# Patient Record
Sex: Female | Born: 1975 | Race: White | Hispanic: No | Smoking: Never smoker
Health system: Southern US, Community
[De-identification: ages and names within clinical notes are randomized; demographics above are authoritative.]

## PROBLEM LIST (undated history)

## (undated) DIAGNOSIS — K219 Gastro-esophageal reflux disease without esophagitis: Secondary | ICD-10-CM

## (undated) DIAGNOSIS — F419 Anxiety disorder, unspecified: Secondary | ICD-10-CM

## (undated) DIAGNOSIS — I10 Essential (primary) hypertension: Secondary | ICD-10-CM

## (undated) HISTORY — PX: URETHERAL RE-IMPLANTATION: SHX2616

## (undated) HISTORY — PX: BREAST REDUCTION SURGERY: SHX8

## (undated) HISTORY — DX: Gastro-esophageal reflux disease without esophagitis: K21.9

## (undated) HISTORY — DX: Essential (primary) hypertension: I10

---

## 1998-01-01 ENCOUNTER — Inpatient Hospital Stay (HOSPITAL_COMMUNITY): Admission: AD | Admit: 1998-01-01 | Discharge: 1998-01-04 | Payer: Self-pay | Admitting: Obstetrics and Gynecology

## 1998-01-07 ENCOUNTER — Encounter (HOSPITAL_COMMUNITY): Admission: RE | Admit: 1998-01-07 | Discharge: 1998-04-07 | Payer: Self-pay | Admitting: *Deleted

## 1999-05-24 ENCOUNTER — Inpatient Hospital Stay (HOSPITAL_COMMUNITY): Admission: AD | Admit: 1999-05-24 | Discharge: 1999-05-26 | Payer: Self-pay | Admitting: Obstetrics and Gynecology

## 1999-06-28 ENCOUNTER — Other Ambulatory Visit: Admission: RE | Admit: 1999-06-28 | Discharge: 1999-06-28 | Payer: Self-pay | Admitting: Obstetrics and Gynecology

## 2001-07-15 ENCOUNTER — Other Ambulatory Visit: Admission: RE | Admit: 2001-07-15 | Discharge: 2001-07-15 | Payer: Self-pay | Admitting: Obstetrics and Gynecology

## 2002-11-16 ENCOUNTER — Other Ambulatory Visit: Admission: RE | Admit: 2002-11-16 | Discharge: 2002-11-16 | Payer: Self-pay | Admitting: Obstetrics and Gynecology

## 2003-12-08 ENCOUNTER — Other Ambulatory Visit: Admission: RE | Admit: 2003-12-08 | Discharge: 2003-12-08 | Payer: Self-pay | Admitting: Obstetrics and Gynecology

## 2005-06-24 ENCOUNTER — Other Ambulatory Visit: Admission: RE | Admit: 2005-06-24 | Discharge: 2005-06-24 | Payer: Self-pay | Admitting: Obstetrics and Gynecology

## 2013-01-06 ENCOUNTER — Ambulatory Visit (INDEPENDENT_AMBULATORY_CARE_PROVIDER_SITE_OTHER): Payer: BC Managed Care – PPO | Admitting: Family Medicine

## 2013-01-06 ENCOUNTER — Encounter: Payer: Self-pay | Admitting: Family Medicine

## 2013-01-06 VITALS — BP 148/86

## 2013-01-06 DIAGNOSIS — K219 Gastro-esophageal reflux disease without esophagitis: Secondary | ICD-10-CM | POA: Insufficient documentation

## 2013-01-06 DIAGNOSIS — I10 Essential (primary) hypertension: Secondary | ICD-10-CM

## 2013-01-06 MED ORDER — ATENOLOL 100 MG PO TABS: 100.0000 mg | ORAL_TABLET | Freq: Every day | ORAL | Status: DC

## 2013-01-06 MED ORDER — OMEPRAZOLE 20 MG PO CPDR: 20.0000 mg | DELAYED_RELEASE_CAPSULE | Freq: Every day | ORAL | Status: DC

## 2013-01-06 MED ORDER — ENALAPRIL MALEATE 20 MG PO TABS: 20.0000 mg | ORAL_TABLET | Freq: Every day | ORAL | Status: DC

## 2013-01-06 NOTE — Progress Notes (Signed)
  Subjective:    Patient ID: Melanie Clark, female    DOB: 1976-05-08, 37 y.o.   MRN: [id number]  Hypertension The current episode started more than 1 year ago. The problem is unchanged. The problem is controlled. There are no associated agents to hypertension. Past treatments include ACE inhibitors and beta blockers. The current treatment provides no improvement. There are no compliance problems.   Gastrophageal Reflux She complains of abdominal pain, belching and heartburn. This is a new problem. The problem occurs frequently. The problem has been unchanged. She has tried a PPI for the symptoms. The treatment provided moderate relief.      Review of Systems  Gastrointestinal: Positive for heartburn and abdominal pain.  All other systems reviewed and are negative.       Objective:   Physical Exam  Alert no acute distress. HEENT normal. Lungs clear. Heart regular rate and rhythm. Abdominal exam benign. Vitals reviewed.      Assessment & Plan:  Impression #1 hypertension good control except recently having run out of one medicine. #2 reflux discussed. Plan medications refilled. May try omeprazole. Try not to use every day. Diet exercise discussed. Appropriate blood work. WSL

## 2013-01-21 LAB — LIPID PANEL
Cholesterol: 134 mg/dL (ref 0–200)
Triglycerides: 80 mg/dL (ref <150)

## 2013-01-21 LAB — BASIC METABOLIC PANEL
BUN: 11 mg/dL (ref 6–23)
CO2: 23 mEq/L (ref 19–32)
Chloride: 105 mEq/L (ref 96–112)
Creat: 0.77 mg/dL (ref 0.50–1.10)

## 2013-01-22 ENCOUNTER — Encounter: Payer: Self-pay | Admitting: Family Medicine

## 2013-07-16 ENCOUNTER — Other Ambulatory Visit: Payer: Self-pay | Admitting: Family Medicine

## 2013-10-14 ENCOUNTER — Other Ambulatory Visit: Payer: Self-pay | Admitting: Family Medicine

## 2013-12-14 ENCOUNTER — Other Ambulatory Visit: Payer: Self-pay | Admitting: Family Medicine

## 2014-03-01 ENCOUNTER — Ambulatory Visit (INDEPENDENT_AMBULATORY_CARE_PROVIDER_SITE_OTHER): Payer: BC Managed Care – PPO | Admitting: Family Medicine

## 2014-03-01 ENCOUNTER — Encounter: Payer: Self-pay | Admitting: Family Medicine

## 2014-03-01 VITALS — BP 118/80 | Temp 97.7°F | Ht 64.5 in | Wt 169.0 lb

## 2014-03-01 DIAGNOSIS — J019 Acute sinusitis, unspecified: Secondary | ICD-10-CM

## 2014-03-01 DIAGNOSIS — I10 Essential (primary) hypertension: Secondary | ICD-10-CM

## 2014-03-01 MED ORDER — FLUCONAZOLE 150 MG PO TABS: 150.0000 mg | ORAL_TABLET | Freq: Once | ORAL | Status: DC

## 2014-03-01 MED ORDER — ENALAPRIL MALEATE 20 MG PO TABS: ORAL_TABLET | ORAL | Status: DC

## 2014-03-01 MED ORDER — ATENOLOL 100 MG PO TABS: ORAL_TABLET | ORAL | Status: DC

## 2014-03-01 MED ORDER — LEVOFLOXACIN 500 MG PO TABS
500.0000 mg | ORAL_TABLET | Freq: Every day | ORAL | Status: DC
Start: 2014-03-01 — End: 2014-11-15

## 2014-03-01 NOTE — Progress Notes (Addendum)
   Subjective:    Patient ID: Melanie Clark, female    DOB: 07-04-1976, 38 y.o.   MRN: [id number]  Cough This is a new problem. Episode onset: 8 weeks ago. The problem has been waxing and waning. The cough is productive of sputum. Associated symptoms include ear congestion, postnasal drip and rhinorrhea. Pertinent negatives include no chest pain, ear pain, fever, shortness of breath or wheezing. Associated symptoms comments: Throat feels swollen and tight, not sore. Nothing aggravates the symptoms. Treatments tried: Tylenol and IBU. The treatment provided mild relief.   Sever cold 8 weeks ago, never got well Now with head sx Glands swollen    Review of Systems  Constitutional: Negative for fever and activity change.  HENT: Positive for congestion, postnasal drip and rhinorrhea. Negative for ear pain.   Eyes: Negative for discharge.  Respiratory: Positive for cough. Negative for shortness of breath and wheezing.   Cardiovascular: Negative for chest pain.       Objective:   Physical Exam  Nursing note and vitals reviewed. Constitutional: She appears well-developed.  HENT:  Head: Normocephalic.  Nose: Nose normal.  Mouth/Throat: Oropharynx is clear and moist. No oropharyngeal exudate.  Neck: Neck supple.  Cardiovascular: Normal rate and normal heart sounds.   No murmur heard. Pulmonary/Chest: Effort normal and breath sounds normal. She has no wheezes.  Lymphadenopathy:    She has no cervical adenopathy.  Skin: Skin is warm and dry.          Assessment & Plan:  Upper respiratory illness along with secondary sinusitis antibiotics prescribed followup if ongoing trouble allergy medicines as well  Patient's blood pressure looks good today refills on medications given followup 6 months

## 2014-03-01 NOTE — Patient Instructions (Signed)
DASH Diet  The DASH diet stands for "Dietary Approaches to Stop Hypertension." It is a healthy eating plan that has been shown to reduce high blood pressure (hypertension) in as little as 14 days, while also possibly providing other significant health benefits. These other health benefits include reducing the risk of breast cancer after menopause and reducing the risk of type 2 diabetes, heart disease, colon cancer, and stroke. Health benefits also include weight loss and slowing kidney failure in patients with chronic kidney disease.   DIET GUIDELINES  · Limit salt (sodium). Your diet should contain less than 1500 mg of sodium daily.  · Limit refined or processed carbohydrates. Your diet should include mostly whole grains. Desserts and added sugars should be used sparingly.  · Include small amounts of heart-healthy fats. These types of fats include nuts, oils, and tub margarine. Limit saturated and trans fats. These fats have been shown to be harmful in the body.  CHOOSING FOODS   The following food groups are based on a 2000 calorie diet. See your Registered Dietitian for individual calorie needs.  Grains and Grain Products (6 to 8 servings daily)  · Eat More Often: Whole-wheat bread, brown rice, whole-grain or wheat pasta, quinoa, popcorn without added fat or salt (air popped).  · Eat Less Often: White bread, white pasta, white rice, cornbread.  Vegetables (4 to 5 servings daily)  · Eat More Often: Fresh, frozen, and canned vegetables. Vegetables may be raw, steamed, roasted, or grilled with a minimal amount of fat.  · Eat Less Often/Avoid: Creamed or fried vegetables. Vegetables in a cheese sauce.  Fruit (4 to 5 servings daily)  · Eat More Often: All fresh, canned (in natural juice), or frozen fruits. Dried fruits without added sugar. One hundred percent fruit juice (½ cup [237 mL] daily).  · Eat Less Often: Dried fruits with added sugar. Canned fruit in light or heavy syrup.  Lean Meats, Fish, and Poultry (2  servings or less daily. One serving is 3 to 4 oz [85-114 g]).  · Eat More Often: Ninety percent or leaner ground beef, tenderloin, sirloin. Round cuts of beef, chicken breast, turkey breast. All fish. Grill, bake, or broil your meat. Nothing should be fried.  · Eat Less Often/Avoid: Fatty cuts of meat, turkey, or chicken leg, thigh, or wing. Fried cuts of meat or fish.  Dairy (2 to 3 servings)  · Eat More Often: Low-fat or fat-free milk, low-fat plain or light yogurt, reduced-fat or part-skim cheese.  · Eat Less Often/Avoid: Milk (whole, 2%). Whole milk yogurt. Full-fat cheeses.  Nuts, Seeds, and Legumes (4 to 5 servings per week)  · Eat More Often: All without added salt.  · Eat Less Often/Avoid: Salted nuts and seeds, canned beans with added salt.  Fats and Sweets (limited)  · Eat More Often: Vegetable oils, tub margarines without trans fats, sugar-free gelatin. Mayonnaise and salad dressings.  · Eat Less Often/Avoid: Coconut oils, palm oils, butter, stick margarine, cream, half and half, cookies, candy, pie.  FOR MORE INFORMATION  The Dash Diet Eating Plan: www.dashdiet.org  Document Released: 09/05/2011 Document Revised: 12/09/2011 Document Reviewed: 09/05/2011  Exit[employer]® Patient Information ©2014 [employer].

## 2014-03-11 ENCOUNTER — Other Ambulatory Visit: Payer: Self-pay | Admitting: Family Medicine

## 2014-07-27 ENCOUNTER — Other Ambulatory Visit: Payer: Self-pay | Admitting: Family Medicine

## 2014-07-27 MED ORDER — ENALAPRIL MALEATE 20 MG PO TABS: ORAL_TABLET | ORAL | Status: DC

## 2014-07-27 MED ORDER — ATENOLOL 100 MG PO TABS: ORAL_TABLET | ORAL | Status: DC

## 2014-07-27 NOTE — Addendum Note (Signed)
Addended byOneal Deputy: Isabellah Sobocinski D on: 07/27/2014 03:33 PM   Modules accepted: Orders

## 2014-08-06 ENCOUNTER — Other Ambulatory Visit: Payer: Self-pay | Admitting: Family Medicine

## 2014-08-08 ENCOUNTER — Other Ambulatory Visit: Payer: Self-pay | Admitting: *Deleted

## 2014-08-08 MED ORDER — OMEPRAZOLE 20 MG PO CPDR: DELAYED_RELEASE_CAPSULE | ORAL | Status: DC

## 2014-11-15 ENCOUNTER — Other Ambulatory Visit: Payer: Self-pay | Admitting: Family Medicine

## 2014-11-15 ENCOUNTER — Other Ambulatory Visit: Payer: Self-pay | Admitting: *Deleted

## 2014-11-15 NOTE — Telephone Encounter (Signed)
[facility]

## 2014-11-15 NOTE — Telephone Encounter (Signed)
Patient has appointment 2/23 for medcheck but will be out of medication on the 19th and needs enough until appointment.

## 2014-11-18 MED ORDER — ENALAPRIL MALEATE 20 MG PO TABS: ORAL_TABLET | ORAL | Status: DC

## 2014-11-18 NOTE — Addendum Note (Signed)
Addended by: Margaretha SheffieldBROWN, Ainsley Sanguinetti S on: 11/18/2014 04:25 PM   Modules accepted: Orders

## 2014-11-18 NOTE — Telephone Encounter (Signed)
Patient needs enalapril refill -has appt scheduled 2/23 Rx sent electronically to pharmacy. Patient notified.

## 2014-11-18 NOTE — Telephone Encounter (Signed)
Left message to return call 

## 2014-11-22 ENCOUNTER — Encounter: Payer: Self-pay | Admitting: Family Medicine

## 2014-11-22 ENCOUNTER — Ambulatory Visit (INDEPENDENT_AMBULATORY_CARE_PROVIDER_SITE_OTHER): Payer: BLUE CROSS/BLUE SHIELD | Admitting: Family Medicine

## 2014-11-22 VITALS — BP 118/72 | Ht 64.0 in | Wt 170.4 lb

## 2014-11-22 DIAGNOSIS — K219 Gastro-esophageal reflux disease without esophagitis: Secondary | ICD-10-CM

## 2014-11-22 DIAGNOSIS — I10 Essential (primary) hypertension: Secondary | ICD-10-CM

## 2014-11-22 MED ORDER — ATENOLOL 100 MG PO TABS: ORAL_TABLET | ORAL | Status: DC

## 2014-11-22 MED ORDER — ENALAPRIL MALEATE 20 MG PO TABS: ORAL_TABLET | ORAL | Status: DC

## 2014-11-22 NOTE — Progress Notes (Signed)
   Subjective:    Patient ID: Melanie Clark, female    DOB: 09-05-1976, 39 y.o.   MRN: [id number]  Hypertension This is a chronic problem. The current episode started more than 1 year ago. Treatments tried: atenolol, vasotec.   Three four times per wk  Takes omep off and on, rarely.  Watching salt intake,    exercises quite a bit. Currently on Wellbutrin to not down appetite. This via her specialist  Review of Systems  no headache no chest pain no back pain abdominal pain no change in bowel habits    Objective:   Physical Exam   alert no acute distress. Lungs clear. Heart rare rhythm. H&T normal.      Assessment & Plan:   impression 1 hypertension good control discussed #2 reflux good control discussed plan diet exercise discussed appropriate blood work medicines for 6 months. Recheck then. WSL

## 2015-02-23 ENCOUNTER — Other Ambulatory Visit: Payer: Self-pay | Admitting: Family Medicine

## 2015-02-23 ENCOUNTER — Other Ambulatory Visit: Payer: Self-pay | Admitting: *Deleted

## 2015-02-23 MED ORDER — ENALAPRIL MALEATE 20 MG PO TABS: ORAL_TABLET | ORAL | Status: DC

## 2015-04-05 ENCOUNTER — Other Ambulatory Visit: Payer: Self-pay | Admitting: Nurse Practitioner

## 2015-04-05 DIAGNOSIS — N644 Mastodynia: Secondary | ICD-10-CM

## 2015-04-07 ENCOUNTER — Ambulatory Visit
Admission: RE | Admit: 2015-04-07 | Discharge: 2015-04-07 | Disposition: A | Discharge status: Home / Self Care | Payer: BLUE CROSS/BLUE SHIELD | Source: Ambulatory Visit | Attending: Nurse Practitioner | Admitting: Nurse Practitioner

## 2015-04-07 DIAGNOSIS — N644 Mastodynia: Secondary | ICD-10-CM

## 2015-05-23 ENCOUNTER — Ambulatory Visit: Payer: BLUE CROSS/BLUE SHIELD | Admitting: Family Medicine

## 2015-08-03 ENCOUNTER — Ambulatory Visit (INDEPENDENT_AMBULATORY_CARE_PROVIDER_SITE_OTHER): Payer: BLUE CROSS/BLUE SHIELD | Admitting: Family Medicine

## 2015-08-03 ENCOUNTER — Encounter: Payer: Self-pay | Admitting: Family Medicine

## 2015-08-03 VITALS — BP 118/90 | Ht 64.0 in | Wt 187.0 lb

## 2015-08-03 DIAGNOSIS — I10 Essential (primary) hypertension: Secondary | ICD-10-CM

## 2015-08-03 DIAGNOSIS — K219 Gastro-esophageal reflux disease without esophagitis: Secondary | ICD-10-CM

## 2015-08-03 DIAGNOSIS — R5383 Other fatigue: Secondary | ICD-10-CM

## 2015-08-03 DIAGNOSIS — Z79899 Other long term (current) drug therapy: Secondary | ICD-10-CM

## 2015-08-03 DIAGNOSIS — E669 Obesity, unspecified: Secondary | ICD-10-CM

## 2015-08-03 MED ORDER — OMEPRAZOLE 20 MG PO CPDR: 20.0000 mg | DELAYED_RELEASE_CAPSULE | Freq: Every day | ORAL | Status: DC

## 2015-08-03 MED ORDER — PHENTERMINE HCL 37.5 MG PO CAPS: 37.5000 mg | ORAL_CAPSULE | ORAL | Status: DC

## 2015-08-03 MED ORDER — ENALAPRIL MALEATE 20 MG PO TABS: ORAL_TABLET | ORAL | Status: DC

## 2015-08-03 MED ORDER — ATENOLOL 100 MG PO TABS: ORAL_TABLET | ORAL | Status: DC

## 2015-08-03 NOTE — Progress Notes (Signed)
   Subjective:    Patient ID: Melanie Clark, female    DOB: 1975/10/07, 39 y.o.   MRN: [id number]  Hypertension This is a chronic problem. The current episode started more than 1 year ago. The problem has been gradually improving since onset. There are no associated agents to hypertension. There are no known risk factors for coronary artery disease. Treatments tried: enalapril, atenolol. The current treatment provides moderate improvement. There are no compliance problems.    Patient states she would like to discuss some weight loss options.   Energy level decent , not a big problem  No obv  thryoid  issues  welbutrin patient now off and states overall doing well.   Very sporadic use of omep etc. however still needs it at times. Generally control symptoms well.  Patient reports frustration with progressive weight gain. No close family history of thyroid disease. Patient working fairly hard on diet and exercise. Notes weight is leading to fatigue along with hip pain no significant symptoms of hypothyroidism  Flu vaccine- declined.    Review of Systems No headache no chest pain no back pain ROS otherwise negative    Objective:   Physical Exam  Alert BMI greater than 30 blood pressure good on repeat HEENT normal neck supple thyroid nonpalpable lungs clear heart regular in rhythm      Assessment & Plan:  Impression 1 obesity discussed major concern the patient body mass index now above 30 #2 hypertension good control maintain same #3 reflux good control maintain same plan initiate phentermine rationale discussed. Check appropriate blood work diet exercise discussed. Refill blood pressure medications refill reflux meds easily 25 minutes spent most in discussion recheck in 3 months WSL

## 2015-08-04 ENCOUNTER — Other Ambulatory Visit (HOSPITAL_COMMUNITY)
Admission: RE | Admit: 2015-08-04 | Discharge: 2015-08-04 | Disposition: A | Discharge status: Home / Self Care | Payer: BLUE CROSS/BLUE SHIELD | Source: Ambulatory Visit | Attending: Family Medicine | Admitting: Family Medicine

## 2015-08-04 DIAGNOSIS — Z79899 Other long term (current) drug therapy: Secondary | ICD-10-CM | POA: Insufficient documentation

## 2015-08-04 DIAGNOSIS — R5383 Other fatigue: Secondary | ICD-10-CM | POA: Diagnosis present

## 2015-08-04 LAB — BASIC METABOLIC PANEL
Anion gap: 6 (ref 5–15)
BUN: 10 mg/dL (ref 6–20)
CALCIUM: 9.1 mg/dL (ref 8.9–10.3)
CO2: 27 mmol/L (ref 22–32)
CREATININE: 0.68 mg/dL (ref 0.44–1.00)
Chloride: 105 mmol/L (ref 101–111)
GFR calc Af Amer: >60 mL/min (ref >60)
GFR calc non Af Amer: >60 mL/min (ref >60)
GLUCOSE: 95 mg/dL (ref 65–99)
Potassium: 4.2 mmol/L (ref 3.5–5.1)
Sodium: 138 mmol/L (ref 135–145)

## 2015-08-04 LAB — LIPID PANEL
Cholesterol: 184 mg/dL (ref 0–200)
HDL: 46 mg/dL (ref >40)
LDL Cholesterol: 128 mg/dL — ABNORMAL HIGH (ref 0–99)
Total CHOL/HDL Ratio: 4 RATIO
Triglycerides: 52 mg/dL (ref <150)
VLDL: 10 mg/dL (ref 0–40)

## 2015-08-04 LAB — TSH: TSH: 2.302 u[IU]/mL (ref 0.350–4.500)

## 2015-08-06 DIAGNOSIS — E669 Obesity, unspecified: Secondary | ICD-10-CM | POA: Insufficient documentation

## 2015-08-08 ENCOUNTER — Encounter: Payer: Self-pay | Admitting: Family Medicine

## 2015-11-03 ENCOUNTER — Ambulatory Visit (INDEPENDENT_AMBULATORY_CARE_PROVIDER_SITE_OTHER): Payer: BLUE CROSS/BLUE SHIELD | Admitting: Family Medicine

## 2015-11-03 ENCOUNTER — Encounter: Payer: Self-pay | Admitting: Family Medicine

## 2015-11-03 VITALS — BP 112/78 | Ht 64.0 in | Wt 177.2 lb

## 2015-11-03 DIAGNOSIS — E669 Obesity, unspecified: Secondary | ICD-10-CM | POA: Diagnosis not present

## 2015-11-03 DIAGNOSIS — I10 Essential (primary) hypertension: Secondary | ICD-10-CM

## 2015-11-03 MED ORDER — PHENTERMINE-TOPIRAMATE ER 3.75-23 MG PO CP24: ORAL_CAPSULE | ORAL | Status: DC

## 2015-11-03 MED ORDER — PHENTERMINE-TOPIRAMATE ER 7.5-46 MG PO CP24: ORAL_CAPSULE | ORAL | Status: DC

## 2015-11-03 NOTE — Progress Notes (Signed)
   Subjective:    Patient ID: Melanie Clark, female    DOB: 07-30-1976, 40 y.o.   MRN: [id number]  Hypertension This is a chronic problem. The current episode started more than 1 year ago. There are no compliance problems.    Patient states no other concerns this visit.  Exercising three to four days per wk, up with pt,   Does heavy cardio for thirty minutes,has mountain bike and hits i  Lost ten pounds dropped off on weight, stayed on the med,  Patient frustrated about her weight. Lost initially good with phentermine. But not now. Would like to consider other options   Review of Systems No headache no chest pain no back pain ROS otherwise negative    Objective:   Physical Exam  Alert vitals stable blood pressure good on repeat HEENT normal lungs clear heart regular in rhythm.      Assessment & Plan:  Impression 1 hypertension discussed #2 obesity with patient motivated to try additional meds plan stop phentermine start qysimia rationale discussed plan medication initiated. Maintain same blood pressure meds follow-up in several months

## 2015-11-07 ENCOUNTER — Other Ambulatory Visit: Payer: Self-pay | Admitting: Family Medicine

## 2015-11-07 NOTE — Telephone Encounter (Signed)
Checking on this 

## 2015-11-07 NOTE — Telephone Encounter (Signed)
Pt states her pharmacy does not have Qysmia that you prescribed   They said if we sent it to [facility] [phone] they would carry it for  Her insurance   Please call pt to let her know it was taken care of

## 2015-11-08 NOTE — Telephone Encounter (Signed)
May I reprint script for Qysmia for patient due to pharmacy we previously sent it into not carrying it and patient requesting it be sent into another pharmacy. Patient just needs the reprint for the 30 day script. Please advise?

## 2015-11-08 NOTE — Telephone Encounter (Signed)
Spoke with patient and informed her that I am unable to find the requested pharmacy in system. Patient stated that she would call back with new pharmacy we can send medication to.

## 2015-11-08 NOTE — Telephone Encounter (Signed)
ok 

## 2015-11-10 MED ORDER — PHENTERMINE-TOPIRAMATE ER 7.5-46 MG PO CP24: ORAL_CAPSULE | ORAL | Status: DC

## 2015-11-10 NOTE — Telephone Encounter (Signed)
Patient called back requesting Qysmia be sent to [facility]. Script printed to be sent into pharmacy.

## 2016-01-30 ENCOUNTER — Ambulatory Visit: Payer: Self-pay | Admitting: Family Medicine

## 2016-01-30 DIAGNOSIS — Z029 Encounter for administrative examinations, unspecified: Secondary | ICD-10-CM

## 2016-02-07 ENCOUNTER — Encounter: Payer: Self-pay | Admitting: Family Medicine

## 2016-02-07 ENCOUNTER — Ambulatory Visit (INDEPENDENT_AMBULATORY_CARE_PROVIDER_SITE_OTHER): Payer: BLUE CROSS/BLUE SHIELD | Admitting: Family Medicine

## 2016-02-07 VITALS — BP 110/72 | Ht 64.0 in | Wt 172.0 lb

## 2016-02-07 DIAGNOSIS — E669 Obesity, unspecified: Secondary | ICD-10-CM

## 2016-02-07 DIAGNOSIS — I10 Essential (primary) hypertension: Secondary | ICD-10-CM

## 2016-02-07 MED ORDER — ATENOLOL 100 MG PO TABS: ORAL_TABLET | ORAL | Status: DC

## 2016-02-07 MED ORDER — ENALAPRIL MALEATE 20 MG PO TABS: ORAL_TABLET | ORAL | Status: DC

## 2016-02-07 MED ORDER — OMEPRAZOLE 20 MG PO CPDR: DELAYED_RELEASE_CAPSULE | ORAL | Status: DC

## 2016-02-07 MED ORDER — PHENTERMINE-TOPIRAMATE ER 7.5-46 MG PO CP24: ORAL_CAPSULE | ORAL | Status: DC

## 2016-02-07 NOTE — Progress Notes (Signed)
   Subjective:    Patient ID: Melanie Clark, female    DOB: [birth year], 40 y.o.   MRN: [id number]  Hypertension Compliance problems include exercise.    Pt needs refill on weight loss med. Taking phentermine-topiramate. Appetite has emproved  Not exercising much,  Busy with sportw and the kifds  Pt states no problems or concerns.   Compliant with blood pressure medication, does not miss a dose. Watching salt intake no obvious side effects Review of Systems No headache, no major weight loss or weight gain, no chest pain no back pain abdominal pain no change in bowel habits complete ROS otherwise negative     Objective:   Physical Exam Alert vitals stable HEENT normal lungs clear heart regular rate and rhythm.       Assessment & Plan:  Impression hypertension good control discussed #2 obesity patient working on a like to stand medication did lose a few pounds and was not exercising hopeful that starting exercises will help more meds refilled recheck in 6 months

## 2016-05-27 ENCOUNTER — Other Ambulatory Visit: Payer: Self-pay | Admitting: Family Medicine

## 2016-11-04 ENCOUNTER — Other Ambulatory Visit: Payer: Self-pay | Admitting: Family Medicine

## 2016-11-04 MED ORDER — ENALAPRIL MALEATE 20 MG PO TABS: 20.0000 mg | ORAL_TABLET | Freq: Every day | ORAL | 0 refills | Status: DC

## 2016-11-04 MED ORDER — PHENTERMINE-TOPIRAMATE ER 7.5-46 MG PO CP24: ORAL_CAPSULE | ORAL | 0 refills | Status: DC

## 2016-11-04 NOTE — Telephone Encounter (Signed)
Medication sent into pharmacy  

## 2016-11-04 NOTE — Telephone Encounter (Signed)
Dr Soyla DryerSteve's thanks

## 2016-11-04 NOTE — Telephone Encounter (Signed)
May I send in refills on medication until patient's appointment.

## 2016-11-04 NOTE — Telephone Encounter (Signed)
Patient called and scheduled appt for next week. Is already out of BP medicine and weight loss medicine.  Can she get a refill until appt? Enalapril 20 mg and weight loss meds (she can't remember name).  Normally does mail order but will use [facility] in [place] for this.   You can call her at [phone] if needed, if not she will check with the pharmacy later today.

## 2016-11-04 NOTE — Telephone Encounter (Signed)
Ok one mo ea

## 2016-11-11 ENCOUNTER — Encounter: Payer: Self-pay | Admitting: Family Medicine

## 2016-11-11 ENCOUNTER — Ambulatory Visit (INDEPENDENT_AMBULATORY_CARE_PROVIDER_SITE_OTHER): Payer: BLUE CROSS/BLUE SHIELD | Admitting: Family Medicine

## 2016-11-11 VITALS — BP 118/82 | Wt 172.6 lb

## 2016-11-11 DIAGNOSIS — I10 Essential (primary) hypertension: Secondary | ICD-10-CM

## 2016-11-11 MED ORDER — PHENTERMINE-TOPIRAMATE ER 7.5-46 MG PO CP24: ORAL_CAPSULE | ORAL | 2 refills | Status: DC

## 2016-11-11 MED ORDER — ATENOLOL 100 MG PO TABS: 100.0000 mg | ORAL_TABLET | Freq: Every day | ORAL | 1 refills | Status: DC

## 2016-11-11 MED ORDER — ENALAPRIL MALEATE 20 MG PO TABS
20.0000 mg | ORAL_TABLET | Freq: Every day | ORAL | 1 refills | Status: DC
Start: 2016-11-11 — End: 2017-03-28

## 2016-11-11 NOTE — Progress Notes (Signed)
   Subjective:    Patient ID: Melanie Clark, female    DOB: [birth year], 41 y.o.   MRN: [id number]  HPI Patient present to office today for medication refills and HTN follow up.  She states she is doing well.  She request all rx's, except Phen[facility]ine (to [facility]) be sent to [facility].  Blood pressure medicine and blood pressure levels reviewed today with patient. Compliant with blood pressure medicine. States does not miss a dose. No obvious side effects. Blood pressure generally good when checked elsewhere. Watching salt intake.   Not exercising a much now, just has restarted the weight loss meds. Fa has hx of obesity   BP levels 120 is h over ow 80s or so   Back on enalapril for the past four days             With just one med 130 ish on the systolic     Results for orders placed or performed during the hospital encounter of 08/04/15  Basic metabolic panel  Result Value Ref Range   Sodium 138 135 - 145 mmol/L   Potassium 4.2 3.5 - 5.1 mmol/L   Chloride 105 101 - 111 mmol/L   CO2 27 22 - 32 mmol/L   Glucose, Bld 95 65 - 99 mg/dL   BUN 10 6 - 20 mg/dL   Creatinine, Ser 4.090.68 0.44 - 1.00 mg/dL   Calcium 9.1 8.9 - 81.110.3 mg/dL   GFR calc non Af Amer >60 >60 mL/min   GFR calc Af Amer >60 >60 mL/min   Anion gap 6 5 - 15  Lipid panel  Result Value Ref Range   Cholesterol 184 0 - 200 mg/dL   Triglycerides 52 <914<150 mg/dL   HDL 46 >78>40 mg/dL   Total CHOL/HDL Ratio 4.0 RATIO   VLDL 10 0 - 40 mg/dL   LDL Cholesterol 295128 (H) 0 - 99 mg/dL  TSH  Result Value Ref Range   TSH 2.302 0.350 - 4.500 uIU/mL     Review of Systems No headache, no major weight loss or weight gain, no chest pain no back pain abdominal pain no change in bowel habits complete ROS otherwise negative     Objective:   Physical Exam Alert vitals stable, NAD. Blood pressure good on repeat. HEENT normal. Lungs clear. Heart regular rate and rhythm.         Assessment & Plan:  Impression 1  hypertension good control discussed maintain same medication #2 obesity discussed BMI 29.6. Has achieve success in past with phentermine topiramate without substantial side effects would like to maintain for now. Patient may call in 3 months. 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 Diet exercise discussed. Check every 6 months WSL

## 2017-03-28 ENCOUNTER — Telehealth: Payer: Self-pay | Admitting: Family Medicine

## 2017-03-28 MED ORDER — ENALAPRIL MALEATE 20 MG PO TABS: 20.0000 mg | ORAL_TABLET | Freq: Every day | ORAL | 0 refills | Status: DC

## 2017-03-28 NOTE — Telephone Encounter (Signed)
[facility]  She said she is completely out.  She has an appointment with Dr. Brett CanalesSteve on 04/08/17 to follow up.

## 2017-03-28 NOTE — Telephone Encounter (Signed)
Prescription sent electronically to pharmacy. Patient notified. 

## 2017-04-08 ENCOUNTER — Encounter: Payer: Self-pay | Admitting: Family Medicine

## 2017-04-08 ENCOUNTER — Ambulatory Visit (INDEPENDENT_AMBULATORY_CARE_PROVIDER_SITE_OTHER): Payer: BLUE CROSS/BLUE SHIELD | Admitting: Family Medicine

## 2017-04-08 VITALS — BP 114/82 | Ht 64.0 in | Wt 169.0 lb

## 2017-04-08 DIAGNOSIS — I159 Secondary hypertension, unspecified: Secondary | ICD-10-CM | POA: Diagnosis not present

## 2017-04-08 DIAGNOSIS — E6609 Other obesity due to excess calories: Secondary | ICD-10-CM | POA: Diagnosis not present

## 2017-04-08 DIAGNOSIS — Z79899 Other long term (current) drug therapy: Secondary | ICD-10-CM

## 2017-04-08 DIAGNOSIS — Z13220 Encounter for screening for lipoid disorders: Secondary | ICD-10-CM

## 2017-04-08 MED ORDER — ENALAPRIL MALEATE 20 MG PO TABS: 20.0000 mg | ORAL_TABLET | Freq: Every day | ORAL | 1 refills | Status: DC

## 2017-04-08 MED ORDER — PHENTERMINE-TOPIRAMATE ER 7.5-46 MG PO CP24: ORAL_CAPSULE | ORAL | 2 refills | Status: DC

## 2017-04-08 MED ORDER — ATENOLOL 100 MG PO TABS: 100.0000 mg | ORAL_TABLET | Freq: Every day | ORAL | 1 refills | Status: DC

## 2017-04-08 NOTE — Progress Notes (Signed)
   Subjective:    Patient ID: Melanie Clark, female    DOB: 1976/09/02, 41 y.o.   MRN: [id number]  Hypertension  This is a recurrent problem. The current episode started more than 1 year ago. The problem has been gradually improving since onset. The problem is controlled.   Blood pressure medicine and blood pressure levels reviewed today with patient. Compliant with blood pressure medicine. States does not miss a dose. No obvious side effects. Blood pressure generally good when checked elsewhere. Watching salt intake.   Used the phentermine and did not do so wll on it, frustrated exercising six days per swk  Was helpfu the to mo she took the phentermine  Reflux rarely takes, not nerarly as much a problem  Split up bp mes twic e per day     Pt states she eats a healthy diet,and exercises six days a week.   No concerns.   Review of Systems No headache, no major weight loss or weight gain, no chest pain no back pain abdominal pain no change in bowel habits complete ROS otherwise negative     Objective:   Physical Exam Alert vitals stable, NAD. Blood pressure good on repeat. HEENT normal. Lungs clear. Heart regular rate and rhythm.        Assessment & Plan:  Impression 1 hypertension good control discussed #2 obesity suboptimum discuss medicine did help. Working with Dr. Magnus Sinningabbs. Medication refill diet exercise discussed. If patient calls in 3 months in weight loss medicine working well will call in 3 more months before 6 months visit

## 2017-04-09 LAB — LIPID PANEL
Chol/HDL Ratio: 3.4 ratio (ref 0.0–4.4)
Cholesterol, Total: 207 mg/dL — ABNORMAL HIGH (ref 100–199)
HDL: 61 mg/dL (ref >39)
LDL Calculated: 132 mg/dL — ABNORMAL HIGH (ref 0–99)
TRIGLYCERIDES: 70 mg/dL (ref 0–149)
VLDL Cholesterol Cal: 14 mg/dL (ref 5–40)

## 2017-04-09 LAB — BASIC METABOLIC PANEL
BUN / CREAT RATIO: 26 — AB (ref 9–23)
BUN: 20 mg/dL (ref 6–24)
CO2: 22 mmol/L (ref 20–29)
Calcium: 9.7 mg/dL (ref 8.7–10.2)
Chloride: 101 mmol/L (ref 96–106)
Creatinine, Ser: 0.77 mg/dL (ref 0.57–1.00)
GFR calc non Af Amer: 95 mL/min/{1.73_m2} (ref >59)
GFR, EST AFRICAN AMERICAN: 109 mL/min/{1.73_m2} (ref >59)
Glucose: 75 mg/dL (ref 65–99)
POTASSIUM: 4.8 mmol/L (ref 3.5–5.2)
Sodium: 139 mmol/L (ref 134–144)

## 2017-04-09 LAB — HEPATIC FUNCTION PANEL
ALT: 15 IU/L (ref 0–32)
AST: 18 IU/L (ref 0–40)
Albumin: 4.6 g/dL (ref 3.5–5.5)
Alkaline Phosphatase: 65 IU/L (ref 39–117)
BILIRUBIN TOTAL: 0.7 mg/dL (ref 0.0–1.2)
BILIRUBIN, DIRECT: 0.14 mg/dL (ref 0.00–0.40)
Total Protein: 7.4 g/dL (ref 6.0–8.5)

## 2017-04-09 LAB — TSH: TSH: 2.05 u[IU]/mL (ref 0.450–4.500)

## 2017-04-13 ENCOUNTER — Encounter: Payer: Self-pay | Admitting: Family Medicine

## 2017-04-15 ENCOUNTER — Telehealth: Payer: Self-pay | Admitting: Family Medicine

## 2017-04-15 NOTE — Telephone Encounter (Signed)
Letter was read and mailed to patient. Patient verbalized understanding

## 2017-04-15 NOTE — Telephone Encounter (Signed)
See copy of letter I dictated read her results and offer to send, not sure why some of these do not appear to be going through

## 2017-04-15 NOTE — Telephone Encounter (Signed)
Pt called to see if lab results were back.  Please call pt with results when they're ready

## 2017-09-29 ENCOUNTER — Other Ambulatory Visit: Payer: Self-pay

## 2017-09-29 MED ORDER — OMEPRAZOLE 20 MG PO CPDR: 20.0000 mg | DELAYED_RELEASE_CAPSULE | Freq: Every day | ORAL | 1 refills | Status: DC

## 2017-11-17 ENCOUNTER — Encounter: Payer: Self-pay | Admitting: Family Medicine

## 2017-11-17 ENCOUNTER — Ambulatory Visit: Payer: BLUE CROSS/BLUE SHIELD | Admitting: Family Medicine

## 2017-11-17 VITALS — BP 118/84 | Ht 64.0 in | Wt 189.0 lb

## 2017-11-17 DIAGNOSIS — I159 Secondary hypertension, unspecified: Secondary | ICD-10-CM

## 2017-11-17 DIAGNOSIS — F320 Major depressive disorder, single episode, mild: Secondary | ICD-10-CM

## 2017-11-17 DIAGNOSIS — I10 Essential (primary) hypertension: Secondary | ICD-10-CM

## 2017-11-17 DIAGNOSIS — E6609 Other obesity due to excess calories: Secondary | ICD-10-CM

## 2017-11-17 MED ORDER — ENALAPRIL MALEATE 20 MG PO TABS: 20.0000 mg | ORAL_TABLET | Freq: Every day | ORAL | 1 refills | Status: DC

## 2017-11-17 MED ORDER — BUPROPION HCL ER (SR) 150 MG PO TB12: ORAL_TABLET | ORAL | 1 refills | Status: DC

## 2017-11-17 MED ORDER — BUPROPION HCL ER (SR) 150 MG PO TB12: ORAL_TABLET | ORAL | 5 refills | Status: DC

## 2017-11-17 MED ORDER — ATENOLOL 100 MG PO TABS: 100.0000 mg | ORAL_TABLET | Freq: Every day | ORAL | 1 refills | Status: DC

## 2017-11-17 MED ORDER — PHENTERMINE-TOPIRAMATE ER 7.5-46 MG PO CP24: ORAL_CAPSULE | ORAL | 2 refills | Status: DC

## 2017-11-17 MED ORDER — OMEPRAZOLE 20 MG PO CPDR: 20.0000 mg | DELAYED_RELEASE_CAPSULE | Freq: Every day | ORAL | 1 refills | Status: DC

## 2017-11-17 NOTE — Progress Notes (Signed)
   Subjective:    Patient ID: Melanie Clark, female    DOB: April 23, 1976, 42 y.o.   MRN: [id number]  HPI  Patient is here today to follow up on HTN. She eats pretty healthy and exercises.She does not see any specialists other than OBGYN.  Blood pressure medicine and blood pressure levels reviewed today with patient. Compliant with blood pressure medicine. States does not miss a dose. No obvious side effects. Blood pressure generally good when checked elsewhere. Watching salt intake.   110to 12 0 systonlic overll numbers good   dimished mtoivation and not feeling as well late tin the winter  Lost ome weight with the meds   weight less  metali ctast in the mouth    feeling down a bit , kids are now in colege daughter at [employer] and son looking for railroad job, still tchinically at home, but overall not dsoing the best a far a seeing t h  Exercising not for several mponths   Review of Systems No headache, no major weight loss or weight gain, no chest pain no back pain abdominal pain no change in bowel habits complete ROS otherwise negative     Objective:   Physical Exam  Alert and oriented, vitals reviewed and stable, NAD ENT-TM's and ext canals WNL bilat via otoscopic exam Soft palate, tonsils and post pharynx WNL via oropharyngeal exam Neck-symmetric, no masses; thyroid nonpalpable and nontender Pulmonary-no tachypnea or accessory muscle use; Clear without wheezes via auscultation Card--no abnrml murmurs, rhythm reg and rate WNL Carotid pulses symmetric, without bruits       Assessment & Plan:  Impression hypertension.  Good control discussed to maintain same meds  2.  Depression, exacerbated by children leaving the house discussed patient would like to try medication.  Will initiate Wellbutrin proper use discussed  3.  Obesity discussed patient would like to try medication again will refill phentermine topiramate  Greater than 50% of this 25 minute face to  face visit was spent in counseling and discussion and coordination of care regarding the above diagnosis/diagnosies Follow-up as scheduled

## 2017-11-21 ENCOUNTER — Telehealth: Payer: Self-pay | Admitting: Family Medicine

## 2017-11-21 NOTE — Telephone Encounter (Signed)
Requesting status of prior authorization for her Qysmia Rx that Dr. Brett CanalesSteve gave her on 11/17/17.  [facility] [address]

## 2017-11-21 NOTE — Telephone Encounter (Signed)
Form faxed today. Pt notified we would follow up on Monday to see if we had a response from insurance.

## 2017-11-24 NOTE — Telephone Encounter (Signed)
Received a fax from rx benefits requesting chart notes. Medical records to print notes and then will be faxed back. Await decision from rx benefits.

## 2017-11-26 NOTE — Telephone Encounter (Signed)
Pt is aware we are still waiting on decision from rx benefits per [place].

## 2017-11-27 NOTE — Telephone Encounter (Signed)
Per pt rx benefits denied.Please see form in box on the wall.

## 2017-11-28 ENCOUNTER — Telehealth: Payer: Self-pay | Admitting: *Deleted

## 2017-11-28 NOTE — Telephone Encounter (Signed)
Discussed with pt. Pt states her insurance will change today and she will have pharm run it through with new insurance and see what happens

## 2017-11-28 NOTE — Telephone Encounter (Signed)
Call pt, we received denial too I will add further info to last note that may or may not help . This is not an appeal, rather a restructuring of the note to go along with their somewhat arbitrary demands

## 2017-12-01 NOTE — Telephone Encounter (Signed)
ok 

## 2017-12-01 NOTE — Telephone Encounter (Signed)
Patient notified that [facility] informed us that they no longer handle the prior authorizations for weight loss meds and sent us the forms for the specialty pharmacy that does this thru the patient's insurance and we filled out the forms and sent the records as requested. patient was also informed of coupons that are available online for the medication thru official  web site.  Patient verbalized understanding.

## 2017-12-01 NOTE — Telephone Encounter (Signed)
Caller name: Relationship to patient: self DPR: Call back [phone] Pharmacy: Last visit: 11/17/17   Reason for call: patient called to let nurse know her rx insurance did not change from last year and the prior auth for medications should still go to that insurance

## 2017-12-14 DIAGNOSIS — R112 Nausea with vomiting, unspecified: Secondary | ICD-10-CM | POA: Diagnosis not present

## 2018-03-08 DIAGNOSIS — J029 Acute pharyngitis, unspecified: Secondary | ICD-10-CM | POA: Diagnosis not present

## 2018-04-10 ENCOUNTER — Telehealth: Payer: Self-pay | Admitting: Family Medicine

## 2018-04-10 MED ORDER — PHENTERMINE-TOPIRAMATE ER 7.5-46 MG PO CP24: ORAL_CAPSULE | ORAL | 2 refills | Status: DC

## 2018-04-10 NOTE — Telephone Encounter (Signed)
Needs refill on Phentermine-Topiramate 7.5-46 MG CP24  Pt has follow up here 05/18/18  Please advise & call pt when done   Original Rx was for 3 months   [facility]

## 2018-04-10 NOTE — Telephone Encounter (Signed)
Ok thre e mo worth 

## 2018-04-10 NOTE — Telephone Encounter (Signed)
Script printed awaiting signature.  

## 2018-04-12 ENCOUNTER — Other Ambulatory Visit: Payer: Self-pay | Admitting: Family Medicine

## 2018-04-13 NOTE — Telephone Encounter (Signed)
Contacted patient to inform her that we would be faxing over her script. Pt stated that she usually takes Qysmia. Looked on patient med list and did not see Qysmia. Informed patient that pharmacy may be giving her name brand. Pt verbalized understanding.

## 2018-04-15 ENCOUNTER — Telehealth: Payer: Self-pay | Admitting: Family Medicine

## 2018-04-15 NOTE — Telephone Encounter (Signed)
Attempted to do PA on Qsymia 7.5-46MG  for patient. [facility] does not handle med loss medication. Contacted [employer] [phone] and they faxed over a form to be completed. Form along with current med list is in provider office.

## 2018-05-01 ENCOUNTER — Telehealth: Payer: Self-pay | Admitting: *Deleted

## 2018-05-01 NOTE — Telephone Encounter (Signed)
Fax from rx benefits stating qsymia has been denied. Please see letter in dr steve's folder for explanation.

## 2018-05-13 DIAGNOSIS — H5200 Hypermetropia, unspecified eye: Secondary | ICD-10-CM | POA: Diagnosis not present

## 2018-05-18 ENCOUNTER — Encounter: Payer: Self-pay | Admitting: Family Medicine

## 2018-05-18 ENCOUNTER — Ambulatory Visit: Payer: 59 | Admitting: Family Medicine

## 2018-05-18 VITALS — BP 124/80 | Ht 64.0 in | Wt 180.4 lb

## 2018-05-18 DIAGNOSIS — F320 Major depressive disorder, single episode, mild: Secondary | ICD-10-CM

## 2018-05-18 DIAGNOSIS — R5383 Other fatigue: Secondary | ICD-10-CM

## 2018-05-18 DIAGNOSIS — E6609 Other obesity due to excess calories: Secondary | ICD-10-CM

## 2018-05-18 DIAGNOSIS — I10 Essential (primary) hypertension: Secondary | ICD-10-CM | POA: Diagnosis not present

## 2018-05-18 DIAGNOSIS — R69 Illness, unspecified: Secondary | ICD-10-CM | POA: Diagnosis not present

## 2018-05-18 MED ORDER — OMEPRAZOLE 20 MG PO CPDR: 20.0000 mg | DELAYED_RELEASE_CAPSULE | Freq: Every day | ORAL | 1 refills | Status: DC

## 2018-05-18 MED ORDER — BUPROPION HCL ER (SR) 150 MG PO TB12: ORAL_TABLET | ORAL | 1 refills | Status: DC

## 2018-05-18 MED ORDER — PHENTERMINE-TOPIRAMATE ER 7.5-46 MG PO CP24: ORAL_CAPSULE | ORAL | 5 refills | Status: DC

## 2018-05-18 MED ORDER — ENALAPRIL MALEATE 20 MG PO TABS: 20.0000 mg | ORAL_TABLET | Freq: Every day | ORAL | 1 refills | Status: DC

## 2018-05-18 MED ORDER — ATENOLOL 100 MG PO TABS: 100.0000 mg | ORAL_TABLET | Freq: Every day | ORAL | 1 refills | Status: DC

## 2018-05-18 NOTE — Progress Notes (Signed)
   Subjective:    Patient ID: Melanie Clark, female    DOB: [birth year], 42 y.o.   MRN: [id number]  Hypertension  This is a chronic problem. There are no compliance problems.   Depression         This is a chronic problem.  Blood pressure medicine and blood pressure levels reviewed today with patient. Compliant with blood pressure medicine. States does not miss a dose. No obvious side effects. Blood pressure generally good when checked elsewhere. Watching salt intake.   110 syt to 60 to 80 on thr bootom    exrcise not so good, not motivated    meds helping     feling overall beter  Tired     Work load I s achallenge   Patient notes ongoing compliance with antidepressant medication. No obvious side effects. Reports does not miss a dose. Overall continues to help depression substantially. No thoughts of homicide or suicide. Would like to maintain medication. Patient states overall better with antidepressants.  And doing well.  No obvious side effects.  Continues to use the weight loss medication.  Not covered by her insurance company.  Notes protracted fatigue and tiredness.  Not exercising.  Not sure why.  Review of Systems  Psychiatric/Behavioral: Positive for depression.  No headache, no major weight loss or weight gain, no chest pain no back pain abdominal pain no change in bowel habits complete ROS otherwise negative      Objective:   Physical Exam Alert and oriented, vitals reviewed and stable, NAD ENT-TM's and ext canals WNL bilat via otoscopic exam Soft palate, tonsils and post pharynx WNL via oropharyngeal exam Neck-symmetric, no masses; thyroid nonpalpable and nontender Pulmonary-no tachypnea or accessory muscle use; Clear without wheezes via auscultation Card--no abnrml murmurs, rhythm reg and rate WNL Carotid pulses symmetric, without bruits        Assessment & Plan:  Impression 1 depression.  Clinically stable.  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  Lives alone.  Works as a Adult nursephysical therapist full-time #2 fatigue.  Etiology unclear but will add some additional blood work.  Patient reports menstrual cycles on the heavy side 3.  Chronic weight gain.  With need for meds  4.  Hypertension.  Discussed.  Maintain medication.  Rationale discussed.  Side effects benefits discussed  Greater than 50% of this 25 minute face to face visit was spent in counseling and discussion and coordination of care regarding the above diagnosis/diagnosies

## 2018-05-19 ENCOUNTER — Encounter: Payer: Self-pay | Admitting: Family Medicine

## 2018-05-19 ENCOUNTER — Other Ambulatory Visit: Payer: Self-pay | Admitting: *Deleted

## 2018-05-19 LAB — LIPID PANEL
CHOLESTEROL TOTAL: 168 mg/dL (ref 100–199)
Chol/HDL Ratio: 3.7 ratio (ref 0.0–4.4)
HDL: 46 mg/dL (ref >39)
LDL Calculated: 102 mg/dL — ABNORMAL HIGH (ref 0–99)
TRIGLYCERIDES: 100 mg/dL (ref 0–149)
VLDL Cholesterol Cal: 20 mg/dL (ref 5–40)

## 2018-05-19 LAB — BASIC METABOLIC PANEL
BUN/Creatinine Ratio: 13 (ref 9–23)
BUN: 12 mg/dL (ref 6–24)
CALCIUM: 9.8 mg/dL (ref 8.7–10.2)
CO2: 24 mmol/L (ref 20–29)
Chloride: 102 mmol/L (ref 96–106)
Creatinine, Ser: 0.93 mg/dL (ref 0.57–1.00)
GFR calc Af Amer: 86 mL/min/{1.73_m2} (ref >59)
GFR calc non Af Amer: 75 mL/min/{1.73_m2} (ref >59)
GLUCOSE: 94 mg/dL (ref 65–99)
Potassium: 5.2 mmol/L (ref 3.5–5.2)
Sodium: 140 mmol/L (ref 134–144)

## 2018-05-19 LAB — CBC WITH DIFFERENTIAL/PLATELET
BASOS ABS: 0 10*3/uL (ref 0.0–0.2)
Basos: 0 %
EOS (ABSOLUTE): 0.2 10*3/uL (ref 0.0–0.4)
Eos: 3 %
Hematocrit: 41.3 % (ref 34.0–46.6)
Hemoglobin: 13.7 g/dL (ref 11.1–15.9)
Immature Grans (Abs): 0 10*3/uL (ref 0.0–0.1)
Immature Granulocytes: 0 %
Lymphocytes Absolute: 1.9 10*3/uL (ref 0.7–3.1)
Lymphs: 25 %
MCH: 28.8 pg (ref 26.6–33.0)
MCHC: 33.2 g/dL (ref 31.5–35.7)
MCV: 87 fL (ref 79–97)
MONOCYTES: 9 %
MONOS ABS: 0.7 10*3/uL (ref 0.1–0.9)
Neutrophils Absolute: 4.9 10*3/uL (ref 1.4–7.0)
Neutrophils: 63 %
PLATELETS: 408 10*3/uL (ref 150–450)
RBC: 4.75 x10E6/uL (ref 3.77–5.28)
RDW: 11.8 % — AB (ref 12.3–15.4)
WBC: 7.7 10*3/uL (ref 3.4–10.8)

## 2018-05-19 LAB — HEPATIC FUNCTION PANEL
ALT: 11 IU/L (ref 0–32)
AST: 14 IU/L (ref 0–40)
Albumin: 4.5 g/dL (ref 3.5–5.5)
Alkaline Phosphatase: 59 IU/L (ref 39–117)
Bilirubin Total: 0.3 mg/dL (ref 0.0–1.2)
Bilirubin, Direct: 0.07 mg/dL (ref 0.00–0.40)
Total Protein: 7.1 g/dL (ref 6.0–8.5)

## 2018-05-19 LAB — TSH: TSH: 2.63 u[IU]/mL (ref 0.450–4.500)

## 2018-05-19 MED ORDER — BUPROPION HCL ER (SR) 150 MG PO TB12: ORAL_TABLET | ORAL | 1 refills | Status: DC

## 2018-05-22 ENCOUNTER — Telehealth: Payer: Self-pay | Admitting: *Deleted

## 2018-05-22 NOTE — Telephone Encounter (Signed)
qsymia denied by insurance. Pt came in to see dr Brett Canalessteve on 8/19. Autumn states dr Brett Canalessteve came out of room and told her that pt was going to pay out of pocket for med. [facility] notified.

## 2018-06-08 ENCOUNTER — Telehealth: Payer: Self-pay | Admitting: Family Medicine

## 2018-06-08 MED ORDER — BUPROPION HCL ER (SR) 150 MG PO TB12: ORAL_TABLET | ORAL | 0 refills | Status: DC

## 2018-06-08 MED ORDER — BUPROPION HCL ER (SR) 150 MG PO TB12: ORAL_TABLET | ORAL | 1 refills | Status: DC

## 2018-06-08 NOTE — Telephone Encounter (Signed)
Prescription sent to local pharmacy and mail order as requested. Left message to return call to notify patient.

## 2018-06-08 NOTE — Telephone Encounter (Signed)
Pt didn't receive Rx from [facility] (mail order) for her buPROPion [facility] SR) 150 MG 12 hr tablet (med list shows it was sent on 05/19/18 but pt did not receive it)  Will run out tomorrow  Needs Rx for 30 day supply sent to [facility] & a Rx sent to [facility] for 90 day supply for her mail order  Please call pt when done

## 2018-06-08 NOTE — Telephone Encounter (Signed)
Please advise 

## 2018-06-08 NOTE — Telephone Encounter (Signed)
Please send in 30-day supply locally Send in 90-day supply to her mail order with 1 refill

## 2018-06-12 NOTE — Telephone Encounter (Signed)
Patient is aware 

## 2018-10-05 ENCOUNTER — Other Ambulatory Visit: Payer: Self-pay | Admitting: Family Medicine

## 2018-10-26 ENCOUNTER — Other Ambulatory Visit: Payer: Self-pay | Admitting: Family Medicine

## 2018-11-18 ENCOUNTER — Other Ambulatory Visit: Payer: Self-pay

## 2018-11-18 ENCOUNTER — Ambulatory Visit: Payer: 59 | Admitting: Family Medicine

## 2018-11-18 ENCOUNTER — Telehealth: Payer: Self-pay | Admitting: Family Medicine

## 2018-11-18 ENCOUNTER — Encounter: Payer: Self-pay | Admitting: Family Medicine

## 2018-11-18 VITALS — BP 102/72 | Ht 64.0 in | Wt 180.0 lb

## 2018-11-18 DIAGNOSIS — E6609 Other obesity due to excess calories: Secondary | ICD-10-CM | POA: Diagnosis not present

## 2018-11-18 DIAGNOSIS — F320 Major depressive disorder, single episode, mild: Secondary | ICD-10-CM | POA: Diagnosis not present

## 2018-11-18 DIAGNOSIS — R69 Illness, unspecified: Secondary | ICD-10-CM | POA: Diagnosis not present

## 2018-11-18 DIAGNOSIS — I10 Essential (primary) hypertension: Secondary | ICD-10-CM

## 2018-11-18 MED ORDER — ATENOLOL 100 MG PO TABS: 100.0000 mg | ORAL_TABLET | Freq: Every day | ORAL | 1 refills | Status: DC

## 2018-11-18 MED ORDER — ENALAPRIL MALEATE 20 MG PO TABS: 20.0000 mg | ORAL_TABLET | Freq: Every day | ORAL | 1 refills | Status: DC

## 2018-11-18 MED ORDER — PHENTERMINE-TOPIRAMATE ER 7.5-46 MG PO CP24: ORAL_CAPSULE | ORAL | 2 refills | Status: DC

## 2018-11-18 NOTE — Progress Notes (Signed)
   Subjective:    Patient ID: Melanie Clark, female    DOB: 02-10-1976, 43 y.o.   MRN: [id number]  HPI Patient is here today to follow up on her chronic health issues.  Hypertension: She takes Atenolol 100 mg one qhs,Enalapril 20 mg one q am  Esophageal Reflux: Omeprazole 20 mg once per prn   Obesity: was on Phentermine - Topiramate 7.5-46 mg CP24, insurance will not pay for.  Depression:Wellbutrin 150 mg one bid.overall doing fine, sometimes feels like e it does not need it,, Has been on for about on e yr,  Now feeling better overall   No hx of meds in the past  Mainly it felt like empty nest syndrome     She is eating healthy and working out.  She sees GYN.  Blood pressure medicine and blood pressure levels reviewed today with patient. Compliant with blood pressure medicine. States does not miss a dose. No obvious side effects. Blood pressure generally good when checked elsewhere. Watching salt intake.   Not cked latly, usually numbers are good,,  More diligent with exercise three to five times per weke, doing weight watchers, feels better  Energy wise  But not losing weight       Review of Systems No headache, no major weight loss or weight gain, no chest pain no back pain abdominal pain no change in bowel habits complete ROS otherwise negative     Objective:   Physical Exam Alert and oriented, vitals reviewed and stable, NAD ENT-TM's and ext canals WNL bilat via otoscopic exam Soft palate, tonsils and post pharynx WNL via oropharyngeal exam Neck-symmetric, no masses; thyroid nonpalpable and nontender Pulmonary-no tachypnea or accessory muscle use; Clear without wheezes via auscultation Card--no abnrml murmurs, rhythm reg and rate WNL Carotid pulses symmetric, without bruits      Assessment & Plan:  Impression #1 hypertension.  Good control discussed maintain same meds  2.  Obesity.  BMI 30.9.  Patient would like to try to get back on the  phentermine topiramate combination which helped her.  This is prescribed and written awaiting her new insurance.  3.  History of depression/stress patient would like to wean off the Wellbutrin.  Rationale discussed.  No major issues at this time.  Patient changed to daily for 2 weeks and every other day for 2 weeks then stop.  Call us if any issues  Greater than 50% of this 25 minute face to face visit was spent in counseling and discussion and coordination of care regarding the above diagnosis/diagnosies   Follow-up in 6 months

## 2018-11-18 NOTE — Telephone Encounter (Signed)
Medication sent to [facility] and pt is aware

## 2018-11-18 NOTE — Telephone Encounter (Signed)
Requesting medications to be sent to listed pharmacy below.  enalapril (VASOTEC) 20 MG tablet   atenolol (TENORMIN) 100 MG tablet   Pharmacy:  [facility], [place] - [address]

## 2018-12-21 ENCOUNTER — Telehealth: Payer: Self-pay | Admitting: Family Medicine

## 2018-12-21 MED ORDER — PHENTERMINE-TOPIRAMATE ER 7.5-46 MG PO CP24: ORAL_CAPSULE | ORAL | 2 refills | Status: DC

## 2018-12-21 NOTE — Telephone Encounter (Signed)
Pt requesting refill of Phentermine-Topiramate 7.5-46 MG CP24  Please [facility] to [facility] (need sto change pharmacies due to new insurance)  Please advise & call pt when done

## 2018-12-21 NOTE — Telephone Encounter (Signed)
Sure three mo worth 

## 2018-12-21 NOTE — Telephone Encounter (Signed)
Printed and awaiting md signature

## 2018-12-21 NOTE — Telephone Encounter (Signed)
Med faxed to pharm. Tried to call and notify pt but voicemail was full

## 2018-12-22 NOTE — Telephone Encounter (Signed)
Pt.notified

## 2019-05-10 ENCOUNTER — Telehealth: Payer: Self-pay | Admitting: Family Medicine

## 2019-05-10 DIAGNOSIS — M25552 Pain in left hip: Secondary | ICD-10-CM

## 2019-05-10 NOTE — Telephone Encounter (Signed)
Referral placed in Epic.

## 2019-05-10 NOTE — Telephone Encounter (Signed)
Let's do 

## 2019-05-10 NOTE — Telephone Encounter (Signed)
Pt requesting referral to Dr. Ninfa Linden (ortho)  Pt states bilat hip pain for months, Left hip worse after a fall 2 weeks ago  Pt's [employer] insurance plan requires referral (entered in to their website for auth # to be given)  Please advise -  Refer or [employer]?  (if refer-please initiate referral in system so that I may process

## 2019-05-10 NOTE — Telephone Encounter (Signed)
Done pt aware °

## 2019-05-10 NOTE — Telephone Encounter (Signed)
Please advise. Thank you

## 2019-05-17 ENCOUNTER — Ambulatory Visit: Payer: Self-pay

## 2019-05-17 ENCOUNTER — Ambulatory Visit (INDEPENDENT_AMBULATORY_CARE_PROVIDER_SITE_OTHER): Payer: 59 | Admitting: Orthopaedic Surgery

## 2019-05-17 VITALS — Ht 64.0 in | Wt 180.0 lb

## 2019-05-17 DIAGNOSIS — M25552 Pain in left hip: Secondary | ICD-10-CM | POA: Diagnosis not present

## 2019-05-17 DIAGNOSIS — M7062 Trochanteric bursitis, left hip: Secondary | ICD-10-CM | POA: Diagnosis not present

## 2019-05-17 NOTE — Progress Notes (Signed)
Office Visit Note   Patient: Melanie Clark           Date of Birth: 1975/10/14           MRN: [name] Visit Date: 05/17/2019              Requested by: Melanie AlbertLuking, William S, MD [address] [place],  [place] [id number] PCP: Melanie AlbertLuking, William S, MD   Assessment & Plan: Visit Diagnoses:  1. Pain in left hip   2. Trochanteric bursitis, left hip     Plan: We went over her x-rays and clinical exam in detail.  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  If things do not improve for her I recommended a steroid injection in her left hip of the trochanteric area.  We can always obtain an MRI if needed to rule out a labral tear or stress fracture.  All question concerns were answered addressed.  Follow-up as otherwise as needed.  Follow-Up Instructions: Return if symptoms worsen or fail to improve.   Orders:  Orders Placed This Encounter  Procedures  . XR HIP UNILAT W OR W/O PELVIS 1V LEFT   No orders of the defined types were placed in this encounter.     Procedures: No procedures performed   Clinical Data: No additional findings.   Subjective: Chief Complaint  Patient presents with  . 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  She fell about 3 weeks ago when she was on balance board and she landed hard on that hip and was having more pain after that but that is calming down.  She still gets a constant ache that is worse with activities and it does wake her up at night.  She is not a diabetic and not a smoker and very active.  She does point to the groin and the lateral aspect of her left hip as a source of her pain.  She has no other issues as relates to her chief complaint of left hip pain.  HPI  Review of Systems She currently denies any headache, chest pain, shortness of breath, fever, chills, nausea, vomiting   Objective: Vital Signs: Ht 5\' 4"  (1.626 m)   Wt 180 lb (81.6 kg)   BMI 30.90 kg/m   Physical Exam She is alert and orient x3 and in no acute distress Ortho Exam Examination of her left hip shows fluid and full range of motion with some pain in the groin and a lot of pain to palpation of the trochanteric area of the left hip. Specialty Comments:  No specialty comments available.  Imaging: Xr Hip Unilat W Or W/o Pelvis 1v Left  Result Date: 05/17/2019 An AP pelvis and lateral left hip shows no acute findings.  The hip joint space is well-maintained.  There is no cortical irregularities or periosteal reaction around the left hip in light of the patient'Clark recent left hip trauma.    PMFS History: Patient Active Problem List   Diagnosis Date Noted  . Depression, major, single episode, mild (HCC) 11/17/2017  . Obesity 08/06/2015  . Essential hypertension, benign 01/06/2013  . Esophageal reflux 01/06/2013   Past Medical History:  Diagnosis Date  . GERD (gastroesophageal reflux disease)   . Hypertension     Family History  Problem Relation Age of Onset  .  Hypertension Mother   . Hypertension Father   . Diabetes Other   . Hypertension Other   . Cancer Other   . Hypertension Other   . Hypertension Paternal Grandmother   . Hypertension Paternal Grandfather     Past Surgical History:  Procedure Laterality Date  . CESAREAN SECTION     Social History   Occupational History  . Not on file  Tobacco Use  . Smoking status: Never Smoker  . Smokeless tobacco: Never Used  Substance and Sexual Activity  . Alcohol use: Yes    Comment: social  . Drug use: No  . Sexual activity: Not on file

## 2019-05-21 ENCOUNTER — Ambulatory Visit (INDEPENDENT_AMBULATORY_CARE_PROVIDER_SITE_OTHER): Payer: 59 | Admitting: Family Medicine

## 2019-05-21 ENCOUNTER — Other Ambulatory Visit: Payer: Self-pay

## 2019-05-21 ENCOUNTER — Ambulatory Visit: Payer: 59 | Admitting: Family Medicine

## 2019-05-21 VITALS — BP 122/78

## 2019-05-21 DIAGNOSIS — I10 Essential (primary) hypertension: Secondary | ICD-10-CM

## 2019-05-21 DIAGNOSIS — E6609 Other obesity due to excess calories: Secondary | ICD-10-CM | POA: Diagnosis not present

## 2019-05-21 MED ORDER — ATENOLOL 100 MG PO TABS: 100.0000 mg | ORAL_TABLET | Freq: Every day | ORAL | 1 refills | Status: DC

## 2019-05-21 MED ORDER — PHENTERMINE-TOPIRAMATE ER 7.5-46 MG PO CP24: ORAL_CAPSULE | ORAL | 2 refills | Status: DC

## 2019-05-21 MED ORDER — ENALAPRIL MALEATE 20 MG PO TABS: 20.0000 mg | ORAL_TABLET | Freq: Every day | ORAL | 1 refills | Status: DC

## 2019-05-21 NOTE — Progress Notes (Signed)
   Subjective:    Patient ID: Melanie Clark, female    DOB: Mar 13, 1976, 43 y.o.   MRN: [id number]  Hypertension This is a chronic problem. The current episode started more than 1 year ago. Treatments tried: atenolol, enalapril. There are no compliance problems.    BP 122/78 this am   Review of Systems Virtual Visit via Video Note  I connected with Melanie Clark on 05/21/19 at  8:40 AM EDT by a video enabled telemedicine application and verified that I am speaking with the correct person using two identifiers.  Location: Patient: home Provider: office   I discussed the limitations of evaluation and management by telemedicine and the availability of in person appointments. The patient expressed understanding and agreed to proceed.  History of Present Illness:    Observations/Objective:   Assessment and Plan:   Follow Up Instructions:    I discussed the assessment and treatment plan with the patient. The patient was provided an opportunity to ask questions and all were answered. The patient agreed with the plan and demonstrated an understanding of the instructions.   The patient was advised to call back or seek an in-person evaluation if the symptoms worsen or if the condition fails to improve as anticipated.  I provided 18 minutes of non-face-to-face time during this encounter.  Patient knows thatqysmia still helps her lose weight.  She would like to stay on it.     Objective:   Physical Exam   Virtual     Assessment & Plan:  Impression hypertension good control discussed blood pressure is good compliance discussed exercising well to maintain same meds  2.  Chronic obesity.  Ongoing frustration for patient medications refilled  Follow-up in 6 months diet exercise discussed

## 2019-05-22 ENCOUNTER — Encounter: Payer: Self-pay | Admitting: Family Medicine

## 2019-05-27 ENCOUNTER — Telehealth: Payer: Self-pay | Admitting: Family Medicine

## 2019-05-27 ENCOUNTER — Other Ambulatory Visit: Payer: Self-pay | Admitting: *Deleted

## 2019-05-27 MED ORDER — BUPROPION HCL ER (SR) 150 MG PO TB12: ORAL_TABLET | ORAL | 1 refills | Status: DC

## 2019-05-27 NOTE — Telephone Encounter (Signed)
Refills sent. Pt notified.

## 2019-05-27 NOTE — Telephone Encounter (Signed)
Ok six mo 

## 2019-05-27 NOTE — Telephone Encounter (Signed)
Pt requesting refill for buPROPion (WELLBUTRIN SR) 150 MG 12 hr tablet  Pt came off of this medication for a couple weeks, but feels like she needs to restart, failed to mention to Dr. Richardson Landry at her recent virtual visit  Please send to [facility], 90 days with refills   Please advise & call pt when done

## 2019-07-01 ENCOUNTER — Telehealth: Payer: Self-pay | Admitting: *Deleted

## 2019-07-01 NOTE — Telephone Encounter (Signed)
request for qsymia 7.5/46mg  ER capsules was approved through cover my meds. Left message to return call to notify pt. [facility] [place] [facility]

## 2019-07-02 NOTE — Telephone Encounter (Signed)
Pt.notified

## 2019-07-05 ENCOUNTER — Other Ambulatory Visit: Payer: Self-pay | Admitting: Obstetrics and Gynecology

## 2019-07-05 DIAGNOSIS — R928 Other abnormal and inconclusive findings on diagnostic imaging of breast: Secondary | ICD-10-CM

## 2019-07-08 ENCOUNTER — Ambulatory Visit
Admission: RE | Admit: 2019-07-08 | Discharge: 2019-07-08 | Disposition: A | Discharge status: Home / Self Care | Payer: 59 | Source: Ambulatory Visit | Attending: Obstetrics and Gynecology | Admitting: Obstetrics and Gynecology

## 2019-07-08 ENCOUNTER — Other Ambulatory Visit: Payer: Self-pay

## 2019-07-08 DIAGNOSIS — R928 Other abnormal and inconclusive findings on diagnostic imaging of breast: Secondary | ICD-10-CM

## 2019-07-23 ENCOUNTER — Other Ambulatory Visit: Payer: Self-pay

## 2019-07-23 DIAGNOSIS — Z20822 Contact with and (suspected) exposure to covid-19: Secondary | ICD-10-CM

## 2019-07-25 LAB — NOVEL CORONAVIRUS, NAA: SARS-CoV-2, NAA: NOT DETECTED

## 2019-10-24 ENCOUNTER — Encounter: Payer: Self-pay | Admitting: Family Medicine

## 2019-10-26 ENCOUNTER — Encounter: Payer: Self-pay | Admitting: Family Medicine

## 2019-10-26 ENCOUNTER — Other Ambulatory Visit: Payer: Self-pay

## 2019-10-26 ENCOUNTER — Ambulatory Visit: Payer: 59 | Admitting: Family Medicine

## 2019-10-26 VITALS — BP 118/80 | Temp 98.4°F | Ht 64.0 in | Wt 180.0 lb

## 2019-10-26 DIAGNOSIS — T148XXA Other injury of unspecified body region, initial encounter: Secondary | ICD-10-CM

## 2019-10-26 DIAGNOSIS — Z13220 Encounter for screening for lipoid disorders: Secondary | ICD-10-CM | POA: Diagnosis not present

## 2019-10-26 DIAGNOSIS — R635 Abnormal weight gain: Secondary | ICD-10-CM | POA: Diagnosis not present

## 2019-10-26 DIAGNOSIS — I10 Essential (primary) hypertension: Secondary | ICD-10-CM | POA: Diagnosis not present

## 2019-10-26 DIAGNOSIS — Z79899 Other long term (current) drug therapy: Secondary | ICD-10-CM | POA: Diagnosis not present

## 2019-10-26 MED ORDER — BUPROPION HCL ER (SR) 150 MG PO TB12: ORAL_TABLET | ORAL | 1 refills | Status: DC

## 2019-10-26 MED ORDER — ATENOLOL 100 MG PO TABS: 100.0000 mg | ORAL_TABLET | Freq: Every day | ORAL | 1 refills | Status: DC

## 2019-10-26 MED ORDER — PHENTERMINE-TOPIRAMATE ER 7.5-46 MG PO CP24: ORAL_CAPSULE | ORAL | 5 refills | Status: DC

## 2019-10-26 MED ORDER — ENALAPRIL MALEATE 20 MG PO TABS: 20.0000 mg | ORAL_TABLET | Freq: Every day | ORAL | 1 refills | Status: DC

## 2019-10-26 NOTE — Progress Notes (Signed)
   Subjective:    Patient ID: Melanie Clark, female    DOB: 1975-11-09, 44 y.o.   MRN: [id number]  HPIpt has noticed bruising for the past couple of months.  Recalls no distinct injuries at these sites.  More bruising than she has had in the past.  No excessive bleeding anywhere else.  Blood pressure medicine and blood pressure levels reviewed today with patient. Compliant with blood pressure medicine. States does not miss a dose. No obvious side effects. Blood pressure generally good when checked elsewhere. Watching salt intake.  Patient's states the Wellbutrin is still helping her.  Would like to maintain the glycemia states that it is definitely helped   Patient trying to exercise regularly.  In the midst of selling her home and moving.  [place] stress with    Review of Systems  No headache, no major weight loss or weight gain, no chest pain no back pain abdominal pain no change in bowel habits complete ROS otherwise negative     Objective:   Physical Exam Alert vitals stable, NAD. Blood pressure good on repeat. HEENT normal. Lungs clear. Heart regular rate and rhythm. Several discrete bruises in various stages of healing.      Assessment & Plan:  Impression 1 hypertension good control discussed to maintain same meds diet exercise discussed  2.  Reflux clinically stable  3.  Weight loss patient to maintain meds  4.  Easy bruisability.  Will evaluate CBC to make sure this is okay diet exercise discussed follow-up in 6 months

## 2019-10-27 LAB — LIPID PANEL
Chol/HDL Ratio: 3.6 ratio (ref 0.0–4.4)
Cholesterol, Total: 200 mg/dL — ABNORMAL HIGH (ref 100–199)
HDL: 55 mg/dL (ref >39)
LDL Chol Calc (NIH): 128 mg/dL — ABNORMAL HIGH (ref 0–99)
Triglycerides: 94 mg/dL (ref 0–149)
VLDL Cholesterol Cal: 17 mg/dL (ref 5–40)

## 2019-10-27 LAB — CBC WITH DIFFERENTIAL/PLATELET
Basophils Absolute: 0 10*3/uL (ref 0.0–0.2)
Basos: 0 %
EOS (ABSOLUTE): 0.3 10*3/uL (ref 0.0–0.4)
Eos: 3 %
Hematocrit: 39 % (ref 34.0–46.6)
Hemoglobin: 13 g/dL (ref 11.1–15.9)
Immature Grans (Abs): 0 10*3/uL (ref 0.0–0.1)
Immature Granulocytes: 0 %
Lymphocytes Absolute: 2.4 10*3/uL (ref 0.7–3.1)
Lymphs: 24 %
MCH: 29.7 pg (ref 26.6–33.0)
MCHC: 33.3 g/dL (ref 31.5–35.7)
MCV: 89 fL (ref 79–97)
Monocytes Absolute: 0.7 10*3/uL (ref 0.1–0.9)
Monocytes: 7 %
Neutrophils Absolute: 6.6 10*3/uL (ref 1.4–7.0)
Neutrophils: 66 %
Platelets: 370 10*3/uL (ref 150–450)
RBC: 4.38 x10E6/uL (ref 3.77–5.28)
RDW: 11.5 % — ABNORMAL LOW (ref 11.7–15.4)
WBC: 10 10*3/uL (ref 3.4–10.8)

## 2019-10-27 LAB — BASIC METABOLIC PANEL
BUN/Creatinine Ratio: 11 (ref 9–23)
BUN: 8 mg/dL (ref 6–24)
CO2: 25 mmol/L (ref 20–29)
Calcium: 9.5 mg/dL (ref 8.7–10.2)
Chloride: 101 mmol/L (ref 96–106)
Creatinine, Ser: 0.71 mg/dL (ref 0.57–1.00)
GFR calc Af Amer: 119 mL/min/{1.73_m2} (ref >59)
GFR calc non Af Amer: 103 mL/min/{1.73_m2} (ref >59)
Glucose: 78 mg/dL (ref 65–99)
Potassium: 4.7 mmol/L (ref 3.5–5.2)
Sodium: 140 mmol/L (ref 134–144)

## 2019-10-27 LAB — HEPATIC FUNCTION PANEL
ALT: 23 IU/L (ref 0–32)
AST: 22 IU/L (ref 0–40)
Albumin: 4.6 g/dL (ref 3.8–4.8)
Alkaline Phosphatase: 57 IU/L (ref 39–117)
Bilirubin Total: 0.3 mg/dL (ref 0.0–1.2)
Bilirubin, Direct: 0.1 mg/dL (ref 0.00–0.40)
Total Protein: 6.9 g/dL (ref 6.0–8.5)

## 2019-10-27 LAB — TSH: TSH: 3.08 u[IU]/mL (ref 0.450–4.500)

## 2019-11-01 ENCOUNTER — Encounter: Payer: Self-pay | Admitting: Family Medicine

## 2020-05-10 ENCOUNTER — Other Ambulatory Visit: Payer: Self-pay | Admitting: *Deleted

## 2020-05-10 MED ORDER — ATENOLOL 100 MG PO TABS: 100.0000 mg | ORAL_TABLET | Freq: Every day | ORAL | 0 refills | Status: DC

## 2020-05-10 MED ORDER — ENALAPRIL MALEATE 20 MG PO TABS: 20.0000 mg | ORAL_TABLET | Freq: Every day | ORAL | 0 refills | Status: DC

## 2020-05-10 MED ORDER — BUPROPION HCL ER (SR) 150 MG PO TB12: ORAL_TABLET | ORAL | 0 refills | Status: DC

## 2020-05-10 NOTE — Telephone Encounter (Signed)
Scheduled 9/20

## 2020-06-19 ENCOUNTER — Other Ambulatory Visit: Payer: Self-pay

## 2020-06-19 ENCOUNTER — Encounter: Payer: Self-pay | Admitting: Family Medicine

## 2020-06-19 ENCOUNTER — Ambulatory Visit (INDEPENDENT_AMBULATORY_CARE_PROVIDER_SITE_OTHER): Payer: 59 | Admitting: Family Medicine

## 2020-06-19 VITALS — BP 124/70 | HR 75 | Temp 96.6°F | Ht 64.0 in | Wt 169.8 lb

## 2020-06-19 DIAGNOSIS — I10 Essential (primary) hypertension: Secondary | ICD-10-CM | POA: Diagnosis not present

## 2020-06-19 DIAGNOSIS — R635 Abnormal weight gain: Secondary | ICD-10-CM

## 2020-06-19 MED ORDER — PHENTERMINE-TOPIRAMATE ER 7.5-46 MG PO CP24: ORAL_CAPSULE | ORAL | 1 refills | Status: DC

## 2020-06-19 NOTE — Progress Notes (Signed)
Patient ID: Melanie Clark, female    DOB: 17-Oct-1975, 44 y.o.   MRN: [id number]   Chief Complaint  Patient presents with  . Establish Care  . Weight Gain   Subjective:    HPI Pt is on qysimia for weight loss.  Pt has been off this for couple months and wanting to restart it. Pt was given this by the last pcp.  Pt has been off this for past 2 months.  Pt working out a lot, doing cardio and weights a few times per week.  Working with a Health and safety inspector, with Dr. Tobi Bastos. Pt stating she "knows what to do, to lose weight." No chest pain, palpitations or short of breath. No headaches.  No problems sleeping.  Pt has been taking qsymia since 2017.  Per chart has been going to [facility] for a couple of these refills. Pt stating when she takes the medication pt weighs around 150-160, today weighting around 169 lbs.  Pt stating ha been off the medication for past 2 months.  Medical History Melanie Clark has a past medical history of GERD (gastroesophageal reflux disease) and Hypertension.   Outpatient Encounter Medications as of 06/19/2020  Medication Sig  . atenolol (TENORMIN) 100 MG tablet Take 1 tablet (100 mg total) by mouth daily.  Marland Kitchen buPROPion (WELLBUTRIN SR) 150 MG 12 hr tablet TAKE 1 TABLET BY MOUTH  TWICE A DAY  . enalapril (VASOTEC) 20 MG tablet Take 1 tablet (20 mg total) by mouth daily.  Marland Kitchen omeprazole (PRILOSEC) 20 MG capsule TAKE 1 CAPSULE BY MOUTH  DAILY  . Phentermine-Topiramate 7.5-46 MG CP24 Take 1 tablet by mouth daily.  . [DISCONTINUED] Phentermine-Topiramate 7.5-46 MG CP24 Take 1 tablet by mouth daily.   No facility-administered encounter medications on file as of 06/19/2020.     Review of Systems  Constitutional: Negative for chills and fever.  HENT: Negative for congestion, rhinorrhea and sore throat.   Respiratory: Negative for cough, shortness of breath and wheezing.   Cardiovascular: Negative for chest pain, palpitations and leg  swelling.  Gastrointestinal: Negative for abdominal pain, diarrhea, nausea and vomiting.  Genitourinary: Negative for dysuria and frequency.  Musculoskeletal: Negative for arthralgias and back pain.  Skin: Negative for rash.  Neurological: Negative for dizziness, weakness and headaches.  Psychiatric/Behavioral: Negative for sleep disturbance.     Vitals BP 124/70   Pulse 75   Temp (!) 96.6 F (35.9 C)   Ht 5\' 4"  (1.626 m)   Wt 169 lb 12.8 oz (77 kg)   SpO2 100%   BMI 29.15 kg/m   Objective:   Physical Exam Vitals and nursing note reviewed.  Constitutional:      Appearance: Normal appearance.  HENT:     Head: Normocephalic and atraumatic.     Nose: Nose normal.     Mouth/Throat:     Mouth: Mucous membranes are moist.     Pharynx: Oropharynx is clear.  Eyes:     Extraocular Movements: Extraocular movements intact.     Conjunctiva/sclera: Conjunctivae normal.     Pupils: Pupils are equal, round, and reactive to light.  Cardiovascular:     Rate and Rhythm: Normal rate and regular rhythm.     Pulses: Normal pulses.     Heart sounds: Normal heart sounds.  Pulmonary:     Effort: Pulmonary effort is normal.     Breath sounds: Normal breath sounds. No wheezing, rhonchi or rales.  Musculoskeletal:  General: Normal range of motion.     Right lower leg: No edema.     Left lower leg: No edema.  Skin:    General: Skin is warm and dry.     Findings: No lesion or rash.  Neurological:     General: No focal deficit present.     Mental Status: She is alert and oriented to person, place, and time.     Cranial Nerves: No cranial nerve deficit.  Psychiatric:        Mood and Affect: Mood normal.        Behavior: Behavior normal.     Assessment and Plan   1. Weight gain - Phentermine-Topiramate 7.5-46 MG CP24; Take 1 tablet by mouth daily.  Dispense: 30 capsule; Refill: 1  2. Essential hypertension, benign   BMI- 29. Pt advised only able to give 2 more months of  medication and pt to let us know if wanting to see weight loss clinic for this.   Reviewed pmp. Pt declining at this time for referral for weight loss clinic.  htn- stable, not needing refills at this time. Labs reviewed.  All meds to go to [facility].   F/u 61mo or prn.

## 2020-08-07 ENCOUNTER — Other Ambulatory Visit: Payer: Self-pay | Admitting: Family Medicine

## 2020-11-06 ENCOUNTER — Other Ambulatory Visit: Payer: Self-pay | Admitting: Family Medicine

## 2020-12-18 ENCOUNTER — Ambulatory Visit: Payer: 59 | Admitting: Family Medicine

## 2021-03-09 ENCOUNTER — Telehealth: Payer: 59

## 2021-03-09 MED ORDER — ENALAPRIL MALEATE 20 MG PO TABS
20.0000 mg | ORAL_TABLET | Freq: Every day | ORAL | 0 refills | Status: DC
Start: 2021-03-09 — End: 2021-04-12

## 2021-03-09 NOTE — Telephone Encounter (Signed)
Please advise. Thank you

## 2021-03-09 NOTE — Telephone Encounter (Signed)
Pt needs refill on enalapril (VASOTEC) 20 MG tablet sent to [facility] not [facility]   Pt call back [phone]

## 2021-03-09 NOTE — Telephone Encounter (Signed)
[facility] and [facility] is [facility]

## 2021-04-03 ENCOUNTER — Telehealth: Payer: Self-pay | Admitting: Family Medicine

## 2021-04-03 NOTE — Telephone Encounter (Signed)
Please schedule appt for med check and then route back for refill. Pt last seen 06/19/20 and was suppose to follow up in 6 months from that ( march)

## 2021-04-03 NOTE — Telephone Encounter (Signed)
Patient is requesting refill on burpropion 150 mg called into [facility]

## 2021-04-04 NOTE — Telephone Encounter (Signed)
Left message to schedule appointment

## 2021-04-12 ENCOUNTER — Ambulatory Visit (INDEPENDENT_AMBULATORY_CARE_PROVIDER_SITE_OTHER): Payer: Self-pay | Admitting: Family Medicine

## 2021-04-12 ENCOUNTER — Other Ambulatory Visit: Payer: Self-pay

## 2021-04-12 VITALS — BP 118/85 | HR 82 | Temp 98.2°F | Ht 64.0 in | Wt 173.6 lb

## 2021-04-12 DIAGNOSIS — K219 Gastro-esophageal reflux disease without esophagitis: Secondary | ICD-10-CM

## 2021-04-12 DIAGNOSIS — I10 Essential (primary) hypertension: Secondary | ICD-10-CM

## 2021-04-12 DIAGNOSIS — F320 Major depressive disorder, single episode, mild: Secondary | ICD-10-CM

## 2021-04-12 MED ORDER — ATENOLOL 100 MG PO TABS: 100.0000 mg | ORAL_TABLET | Freq: Every day | ORAL | 1 refills | Status: DC

## 2021-04-12 MED ORDER — OMEPRAZOLE 20 MG PO CPDR: 20.0000 mg | DELAYED_RELEASE_CAPSULE | Freq: Every day | ORAL | 1 refills | Status: DC

## 2021-04-12 MED ORDER — BUPROPION HCL ER (SR) 150 MG PO TB12: 150.0000 mg | ORAL_TABLET | Freq: Two times a day (BID) | ORAL | 1 refills | Status: DC

## 2021-04-12 MED ORDER — ENALAPRIL MALEATE 20 MG PO TABS: 20.0000 mg | ORAL_TABLET | Freq: Every day | ORAL | 1 refills | Status: DC

## 2021-04-12 NOTE — Progress Notes (Signed)
Patient ID: Melanie Clark, female    DOB: [birth year], 45 y.o.   MRN: [id number]   Chief Complaint  Patient presents with   Hypertension    Follow up   Subjective:    HPI  HTN Pt compliant with BP meds.  No SEs Denies chest pain, sob, LE swelling, or blurry vision. Taking atenolol and enalapril.  Well woman exam- seeing Dr. Henderson Cloud, [facility],  Last pap- 12/2020, normal per pt. Mammo 4/22- normal.   Pt is seeing [facility]- weight loss clinic.  Pt is taking 0.5mg  testosterone weekly and wegovy.  Just started 1 wk ago.  H/o depression- stable. Taking wellbutrin 150mg  SR bid.  Medical History Shealee has a past medical history of GERD (gastroesophageal reflux disease) and Hypertension.   Outpatient Encounter Medications as of 04/12/2021  Medication Sig   atenolol (TENORMIN) 100 MG tablet Take 1 tablet (100 mg total) by mouth daily.   buPROPion (WELLBUTRIN SR) 150 MG 12 hr tablet Take 1 tablet (150 mg total) by mouth 2 (two) times daily.   enalapril (VASOTEC) 20 MG tablet Take 1 tablet (20 mg total) by mouth daily.   omeprazole (PRILOSEC) 20 MG capsule Take 1 capsule (20 mg total) by mouth daily.   [DISCONTINUED] atenolol (TENORMIN) 100 MG tablet TAKE 1 TABLET DAILY   [DISCONTINUED] buPROPion (WELLBUTRIN SR) 150 MG 12 hr tablet TAKE 1 TABLET TWICE A DAY   [DISCONTINUED] enalapril (VASOTEC) 20 MG tablet Take 1 tablet (20 mg total) by mouth daily.   [DISCONTINUED] omeprazole (PRILOSEC) 20 MG capsule TAKE 1 CAPSULE BY MOUTH  DAILY   [DISCONTINUED] Phentermine-Topiramate 7.5-46 MG CP24 Take 1 tablet by mouth daily. (Patient not taking: Reported on 04/12/2021)   No facility-administered encounter medications on file as of 04/12/2021.     Review of Systems  Constitutional:  Negative for chills and fever.  HENT:  Negative for congestion, rhinorrhea and sore throat.   Respiratory:  Negative for cough, shortness of breath and wheezing.   Cardiovascular:  Negative for  chest pain and leg swelling.  Gastrointestinal:  Negative for abdominal pain, diarrhea, nausea and vomiting.  Genitourinary:  Negative for dysuria and frequency.  Musculoskeletal:  Negative for arthralgias and back pain.  Skin:  Negative for rash.  Neurological:  Negative for dizziness, weakness and headaches.    Vitals BP 118/85   Pulse 82   Temp 98.2 F (36.8 C) (Oral)   Ht 5\' 4"  (1.626 m)   Wt 173 lb 9.6 oz (78.7 kg)   SpO2 99%   BMI 29.80 kg/m   Objective:   Physical Exam Vitals and nursing note reviewed.  Constitutional:      General: She is not in acute distress.    Appearance: Normal appearance. She is not ill-appearing.  HENT:     Head: Normocephalic and atraumatic.     Nose: Nose normal.     Mouth/Throat:     Mouth: Mucous membranes are moist.     Pharynx: Oropharynx is clear.  Eyes:     Extraocular Movements: Extraocular movements intact.     Conjunctiva/sclera: Conjunctivae normal.     Pupils: Pupils are equal, round, and reactive to light.  Cardiovascular:     Rate and Rhythm: Normal rate and regular rhythm.     Pulses: Normal pulses.     Heart sounds: Normal heart sounds.  Pulmonary:     Effort: Pulmonary effort is normal.     Breath sounds: Normal breath sounds. No wheezing, rhonchi or rales.  Musculoskeletal:        General: Normal range of motion.     Right lower leg: No edema.     Left lower leg: No edema.  Skin:    General: Skin is warm and dry.     Findings: No lesion or rash.  Neurological:     General: No focal deficit present.     Mental Status: She is alert and oriented to person, place, and time.  Psychiatric:        Mood and Affect: Mood normal.        Behavior: Behavior normal.     Assessment and Plan   1. Essential hypertension, benign - enalapril (VASOTEC) 20 MG tablet; Take 1 tablet (20 mg total) by mouth daily.  Dispense: 90 tablet; Refill: 1 - atenolol (TENORMIN) 100 MG tablet; Take 1 tablet (100 mg total) by mouth daily.   Dispense: 90 tablet; Refill: 1  2. Depression, major, single episode, mild (HCC) - buPROPion (WELLBUTRIN SR) 150 MG 12 hr tablet; Take 1 tablet (150 mg total) by mouth 2 (two) times daily.  Dispense: 180 tablet; Refill: 1  3. Gastroesophageal reflux disease, unspecified whether esophagitis present - omeprazole (PRILOSEC) 20 MG capsule; Take 1 capsule (20 mg total) by mouth daily.  Dispense: 90 capsule; Refill: 1   Htn- stable, cont meds.  Depression- stable. Cont meds.  Gerd- stable. Cont meds.  Return in about 6 months (around 10/13/2021) for f/u htn, gerd, depression.

## 2021-09-19 IMAGING — MG MM DIGITAL DIAGNOSTIC UNILAT*L* W/ TOMO W/ CAD
6 series · 6 of 18 positions shown · non-contrast
Comparison: Previous exam(s).
COMPARISON: Previous exam(s).

Addendum:
CLINICAL DATA: Left breast lower outer quadrant focal asymmetry
seen on most recent screening mammography. History of bilateral
breast reduction mammoplasty.

EXAM:
DIGITAL DIAGNOSTIC BILATERAL MAMMOGRAM WITH CAD AND TOMO
ULTRASOUND LEFT BREAST

[L MLO synth-2D (1 of 2)]
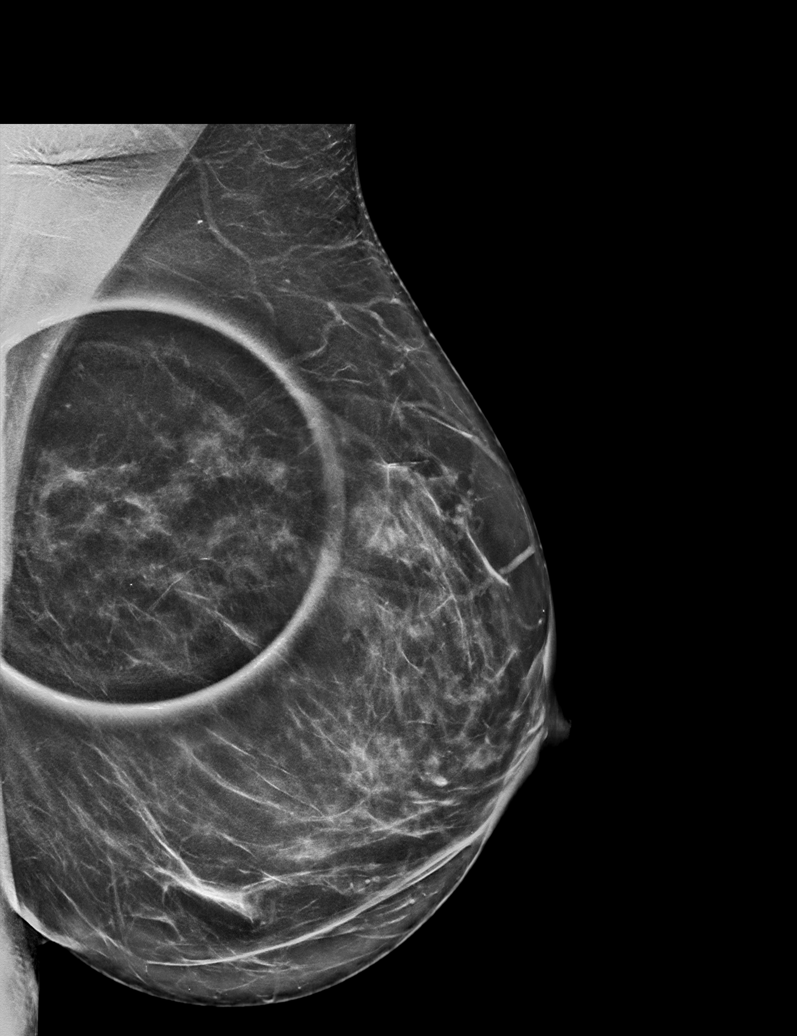

[L MLO synth-2D (2 of 2)]
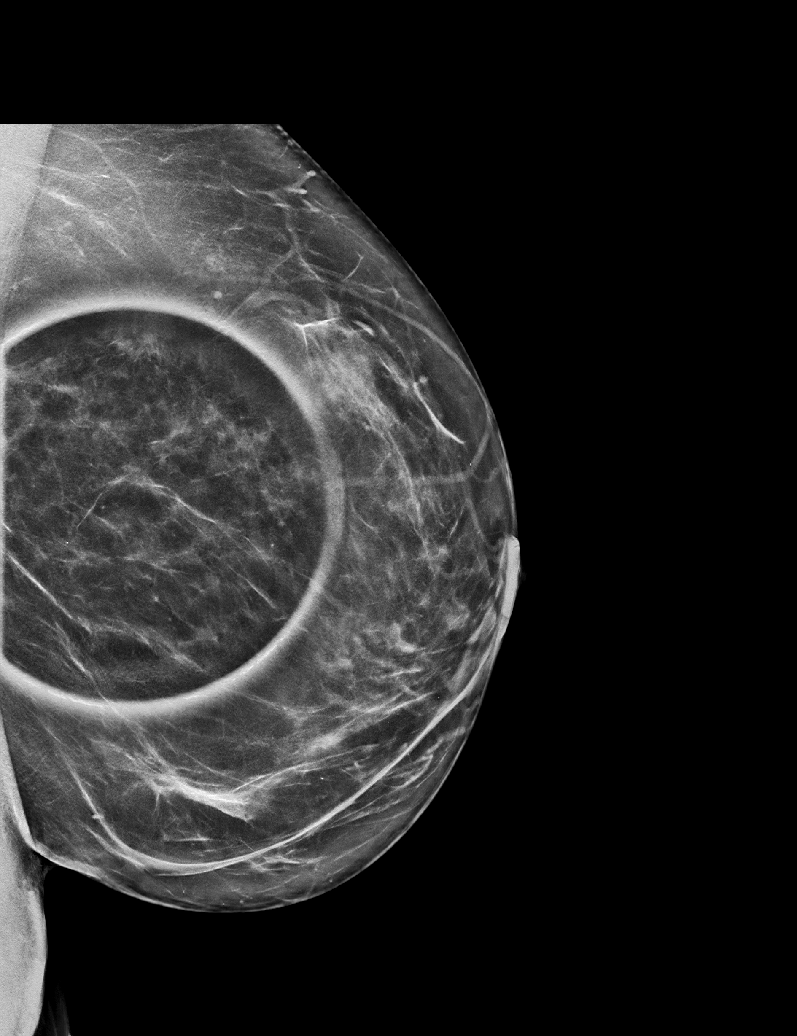

[L CC synth-2D]
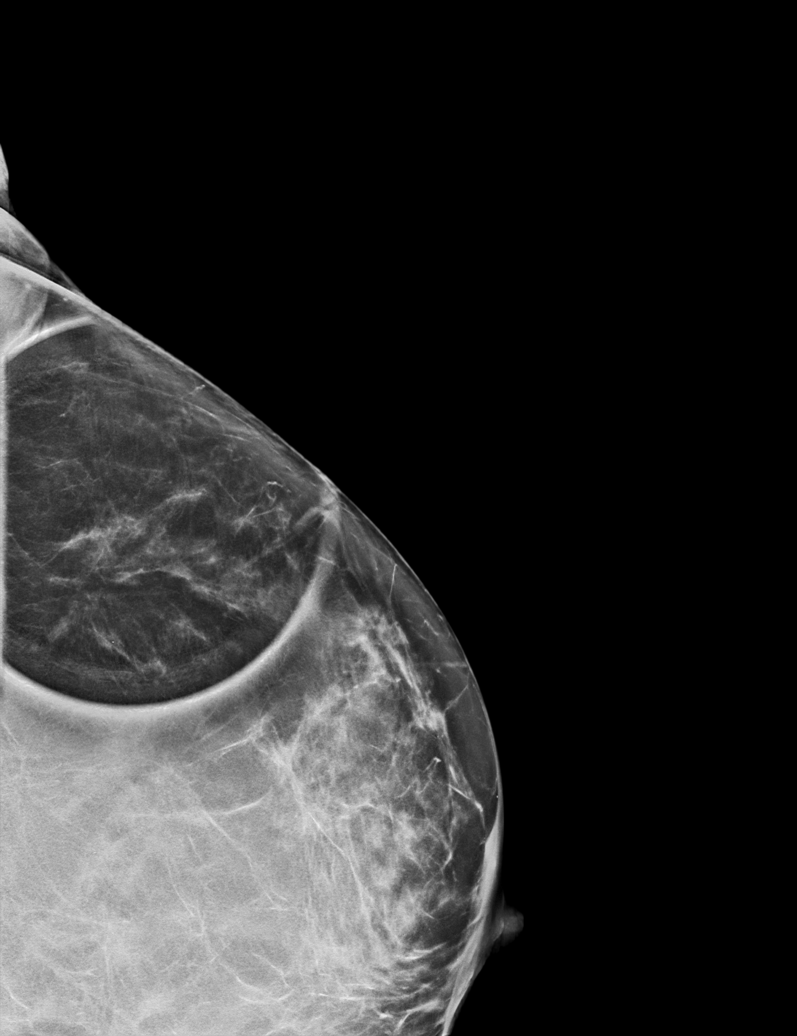

[L MLO tomo (1 of 2) · tomo slice 39/76.0]
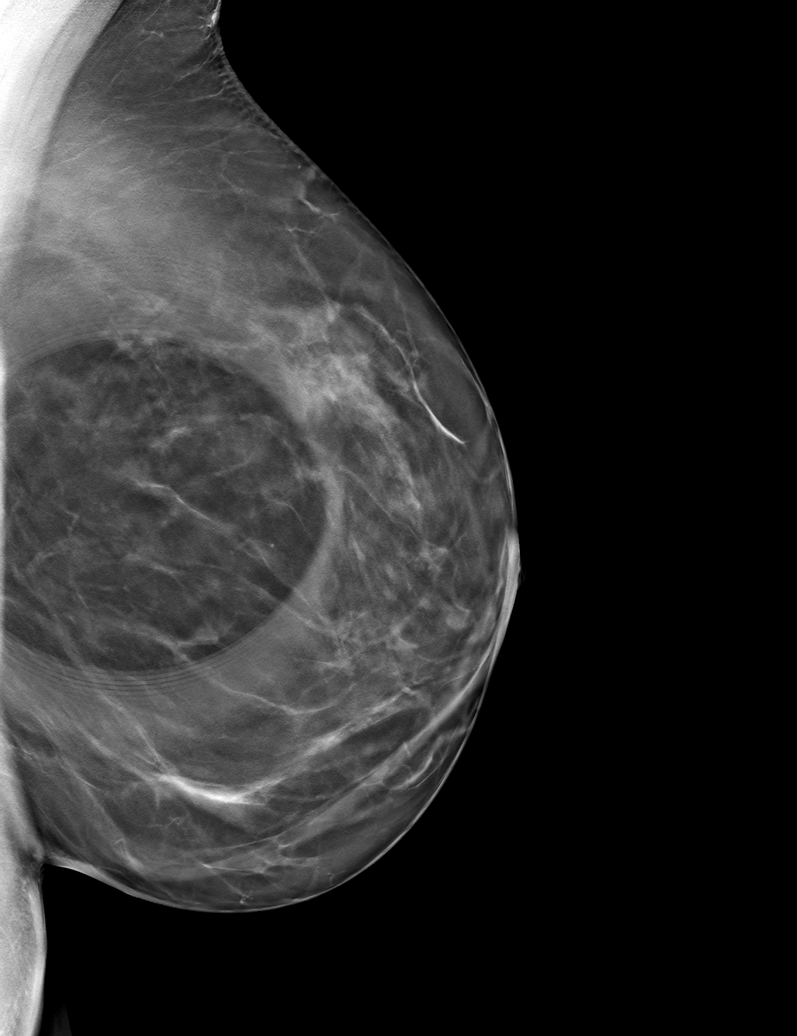

[L MLO tomo (2 of 2) · tomo slice 40/79.0]
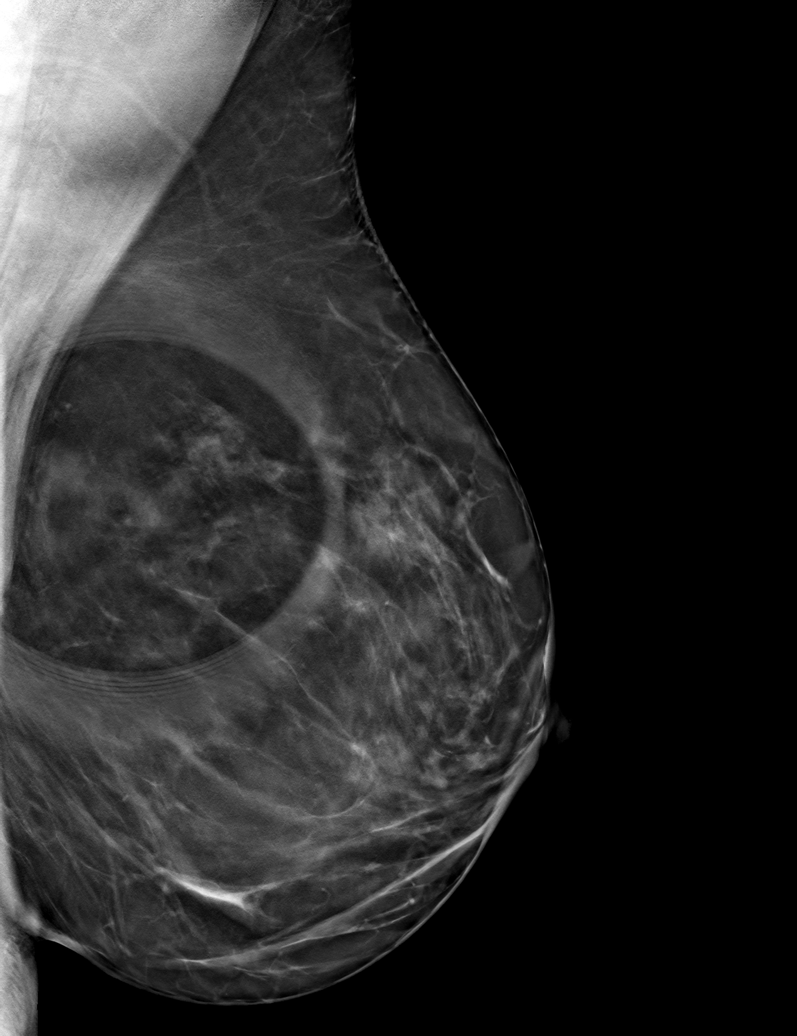

[L CC tomo · tomo slice 33/65.0]
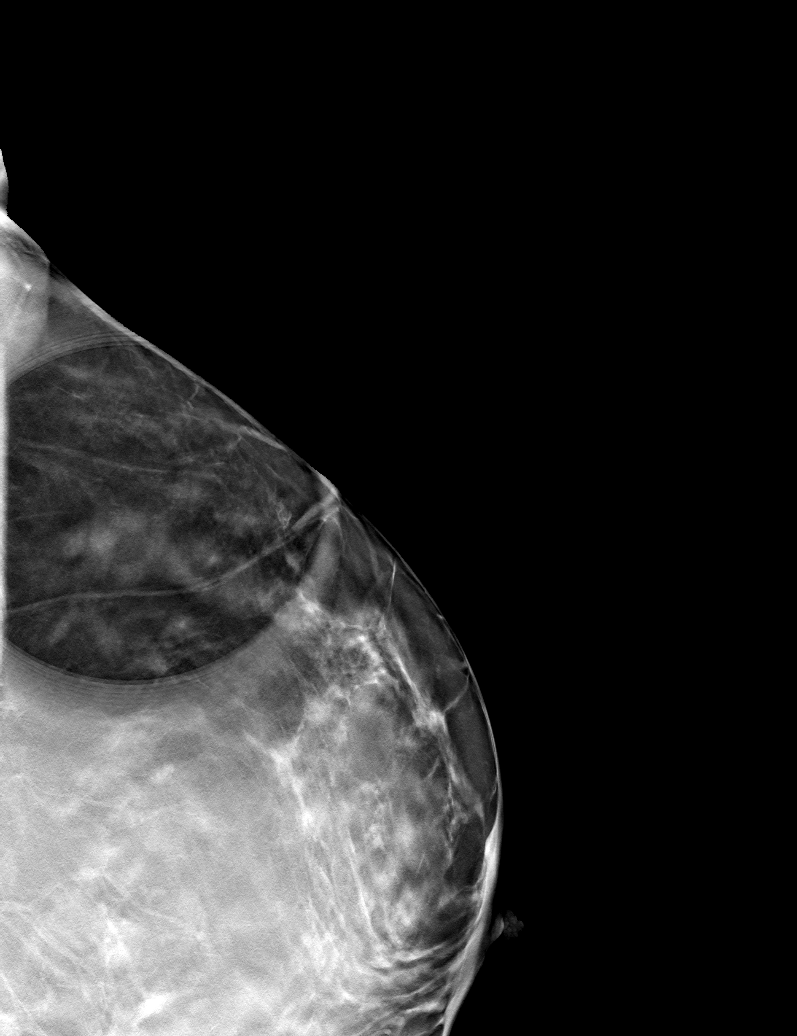

[6 of 18 positions shown; findings below may reference images not displayed]

ACR Breast Density Category b: There are scattered areas of
fibroglandular density.
FINDINGS: Additional mammographic views of the left breast demonstrate no
suspicious masses or areas of nonsurgical architectural distortion.
The previously questioned focal asymmetry in the left breast lower
outer quadrant, posterior depth, has the appearance of postsurgical
scarring.

Mammographic images were processed with CAD.

On physical exam, no suspicious masses are palpated.

Targeted ultrasound is performed, showing no suspicious masses or
shadowing lesions.
IMPRESSION: No mammographic or sonographic evidence of left breast malignancy.

RECOMMENDATION:
Screening mammogram in one year.(Code:EO-R-ATT)

I have discussed the findings and recommendations with the patient.
If applicable, a reminder letter will be sent to the patient
regarding the next appointment.

BI-RADS CATEGORY  2: Benign.

EXAM:
Digital diagnostic unilateral mammogram with CAD and tomosynthesis.

*** End of Addendum ***
ACR Breast Density Category b: There are scattered areas of
fibroglandular density.
FINDINGS: Additional mammographic views of the left breast demonstrate no
suspicious masses or areas of nonsurgical architectural distortion.
The previously questioned focal asymmetry in the left breast lower
outer quadrant, posterior depth, has the appearance of postsurgical
scarring.

Mammographic images were processed with CAD.

On physical exam, no suspicious masses are palpated.

Targeted ultrasound is performed, showing no suspicious masses or
shadowing lesions.
IMPRESSION: No mammographic or sonographic evidence of left breast malignancy.

RECOMMENDATION:
Screening mammogram in one year.(Code:EO-R-ATT)

I have discussed the findings and recommendations with the patient.
If applicable, a reminder letter will be sent to the patient
regarding the next appointment.

BI-RADS CATEGORY  2: Benign.

## 2021-11-17 ENCOUNTER — Other Ambulatory Visit: Payer: Self-pay | Admitting: Family Medicine

## 2021-11-17 DIAGNOSIS — I10 Essential (primary) hypertension: Secondary | ICD-10-CM

## 2021-11-17 DIAGNOSIS — F320 Major depressive disorder, single episode, mild: Secondary | ICD-10-CM

## 2021-11-21 NOTE — Telephone Encounter (Signed)
Sent my chart message to schedule appointment 11/21/21

## 2021-11-23 NOTE — Telephone Encounter (Signed)
Has appointment on 12/06/21 she states doesnt need refill right now

## 2021-12-06 ENCOUNTER — Encounter: Payer: Self-pay | Admitting: Family Medicine

## 2021-12-06 ENCOUNTER — Other Ambulatory Visit: Payer: Self-pay

## 2021-12-06 ENCOUNTER — Ambulatory Visit (INDEPENDENT_AMBULATORY_CARE_PROVIDER_SITE_OTHER): Payer: BLUE CROSS/BLUE SHIELD | Admitting: Family Medicine

## 2021-12-06 VITALS — BP 133/76 | HR 68 | Temp 97.6°F | Wt 140.2 lb

## 2021-12-06 DIAGNOSIS — K219 Gastro-esophageal reflux disease without esophagitis: Secondary | ICD-10-CM | POA: Diagnosis not present

## 2021-12-06 DIAGNOSIS — Z1321 Encounter for screening for nutritional disorder: Secondary | ICD-10-CM

## 2021-12-06 DIAGNOSIS — E785 Hyperlipidemia, unspecified: Secondary | ICD-10-CM | POA: Diagnosis not present

## 2021-12-06 DIAGNOSIS — Z130 Encounter for screening for diseases of the blood and blood-forming organs and certain disorders involving the immune mechanism: Secondary | ICD-10-CM | POA: Diagnosis not present

## 2021-12-06 DIAGNOSIS — I10 Essential (primary) hypertension: Secondary | ICD-10-CM | POA: Diagnosis not present

## 2021-12-06 DIAGNOSIS — B353 Tinea pedis: Secondary | ICD-10-CM

## 2021-12-06 DIAGNOSIS — F320 Major depressive disorder, single episode, mild: Secondary | ICD-10-CM

## 2021-12-06 MED ORDER — FLUCONAZOLE 150 MG PO TABS: 150.0000 mg | ORAL_TABLET | ORAL | 0 refills | Status: DC

## 2021-12-06 MED ORDER — OMEPRAZOLE 20 MG PO CPDR: 20.0000 mg | DELAYED_RELEASE_CAPSULE | Freq: Every day | ORAL | 1 refills | Status: DC

## 2021-12-06 NOTE — Assessment & Plan Note (Signed)
Stable.  Continue enalapril and atenolol. ?

## 2021-12-06 NOTE — Assessment & Plan Note (Signed)
Stable.  Continue omeprazole.  Refilled today. 

## 2021-12-06 NOTE — Assessment & Plan Note (Signed)
Stable.  Continue Wellbutrin. 

## 2021-12-06 NOTE — Patient Instructions (Signed)
Labs today. ? ?I have sent in your diflucan and omeprazole. The others were already sent in. ? ?Follow up in 6 months. ? ?Take care ? ?Dr. Adriana Simas  ?

## 2021-12-06 NOTE — Progress Notes (Signed)
? ?Subjective:  ?Patient ID: Melanie Clark, female    DOB: 1975/12/02  Age: 47 y.o. MRN: [id number] ? ?CC: ?Chief Complaint  ?Patient presents with  ? Establish Care  ?  Refills on Prilosec  ? ? ?HPI: ? ?46 year old female with GERD, tension, depression presents for follow-up. ? ?Patient reports that her reflux is well controlled.  Uses omeprazole intermittently.  Needs refill. ? ?Hypertension well-controlled on atenolol and enalapril. ? ?Patient's depression is stable on Wellbutrin. ? ?Patient reports tinea pedis.  She has been treating with over-the-counter topicals without resolution.  She would like to discuss treatment options today. ? ? ?Patient Active Problem List  ? Diagnosis Date Noted  ? GERD (gastroesophageal reflux disease) 12/06/2021  ? Tinea pedis 12/06/2021  ? Depression, major, single episode, mild ([place]) 11/17/2017  ? Essential hypertension, benign 01/06/2013  ? ? ?Social Hx   ?Social History  ? ?Socioeconomic History  ? Marital status: Unknown  ?  Spouse name: Not on file  ? Number of children: Not on file  ? Years of education: Not on file  ? Highest education level: Not on file  ?Occupational History  ? Not on file  ?Tobacco Use  ? Smoking status: Never  ? Smokeless tobacco: Never  ?Substance and Sexual Activity  ? Alcohol use: Yes  ?  Comment: social  ? Drug use: No  ? Sexual activity: Not on file  ?Other Topics Concern  ? Not on file  ?Social History Narrative  ? Not on file  ? ?Social Determinants of Health  ? ?Financial Resource Strain: Not on file  ?Food Insecurity: Not on file  ?Transportation Needs: Not on file  ?Physical Activity: Not on file  ?Stress: Not on file  ?Social Connections: Not on file  ? ? ?Review of Systems ?Per HPI ? ?Objective:  ?BP 133/76   Pulse 68   Temp 97.6 ?F (36.4 ?C)   Wt 140 lb 3.2 oz (63.6 kg)   SpO2 100%   BMI 24.07 kg/m?  ? ?BP/Weight 12/06/2021 04/12/2021 06/19/2020  ?Systolic BP 624 469 507  ?Diastolic BP 76 85 70  ?Wt. (Lbs) 140.2 173.6 169.8  ?BMI  24.07 29.8 29.15  ? ? ?Physical Exam ?Constitutional:   ?   General: She is not in acute distress. ?   Appearance: Normal appearance. She is not ill-appearing.  ?HENT:  ?   Head: Normocephalic and atraumatic.  ?Eyes:  ?   General:     ?   Right eye: No discharge.     ?   Left eye: No discharge.  ?   Conjunctiva/sclera: Conjunctivae normal.  ?Cardiovascular:  ?   Rate and Rhythm: Normal rate and regular rhythm.  ?   Heart sounds: No murmur heard. ?Pulmonary:  ?   Effort: Pulmonary effort is normal.  ?   Breath sounds: Normal breath sounds. No wheezing, rhonchi or rales.  ?Neurological:  ?   Mental Status: She is alert.  ?Psychiatric:     ?   Mood and Affect: Mood normal.     ?   Behavior: Behavior normal.  ? ? ?Lab Results  ?Component Value Date  ? WBC 10.0 10/26/2019  ? HGB 13.0 10/26/2019  ? HCT 39.0 10/26/2019  ? PLT 370 10/26/2019  ? GLUCOSE 78 10/26/2019  ? CHOL 200 (H) 10/26/2019  ? TRIG 94 10/26/2019  ? HDL 55 10/26/2019  ? [place] 128 (H) 10/26/2019  ? ALT 23 10/26/2019  ? AST 22 10/26/2019  ? NA  140 10/26/2019  ? K 4.7 10/26/2019  ? CL 101 10/26/2019  ? CREATININE 0.71 10/26/2019  ? BUN 8 10/26/2019  ? CO2 25 10/26/2019  ? TSH 3.080 10/26/2019  ? ? ? ?Assessment & Plan:  ? ?Problem List Items Addressed This Visit   ? ?  ? Cardiovascular and Mediastinum  ? Essential hypertension, benign - Primary  ?  Stable.  Continue enalapril and atenolol. ?  ?  ? Relevant Orders  ? CMP14+EGFR  ?  ? Digestive  ? GERD (gastroesophageal reflux disease)  ?  Stable.  Continue omeprazole.  Refilled today. ?  ?  ? Relevant Medications  ? omeprazole (PRILOSEC) 20 MG capsule  ?  ? Musculoskeletal and Integument  ? Tinea pedis  ?  Refractory.  Diflucan once weekly. ?  ?  ? Relevant Medications  ? fluconazole (DIFLUCAN) 150 MG tablet  ?  ? Other  ? Depression, major, single episode, mild ([place])  ?  Stable.  Continue Wellbutrin. ?  ?  ? ?Other Visit Diagnoses   ? ? Hyperlipidemia, unspecified hyperlipidemia type      ? Relevant Orders   ? Lipid panel  ? Screening for deficiency anemia      ? Relevant Orders  ? CBC  ? Encounter for vitamin deficiency screening      ? Relevant Orders  ? Vitamin D (25 hydroxy)  ? ?  ? ? ?Meds ordered this encounter  ?Medications  ? omeprazole (PRILOSEC) 20 MG capsule  ?  Sig: Take 1 capsule (20 mg total) by mouth daily.  ?  Dispense:  90 capsule  ?  Refill:  1  ? fluconazole (DIFLUCAN) 150 MG tablet  ?  Sig: Take 1 tablet (150 mg total) by mouth once a week.  ?  Dispense:  6 tablet  ?  Refill:  0  ? ? ?Follow-up:  Return in about 6 months (around 06/08/2022). ? ?Thersa Salt DO ?[place] ? ?

## 2021-12-06 NOTE — Assessment & Plan Note (Signed)
Refractory.  Diflucan once weekly. ?

## 2022-02-06 ENCOUNTER — Other Ambulatory Visit: Payer: Self-pay

## 2022-02-06 DIAGNOSIS — F320 Major depressive disorder, single episode, mild: Secondary | ICD-10-CM

## 2022-02-06 MED ORDER — BUPROPION HCL ER (SR) 150 MG PO TB12: 150.0000 mg | ORAL_TABLET | Freq: Two times a day (BID) | ORAL | 0 refills | Status: DC

## 2022-06-07 ENCOUNTER — Ambulatory Visit (INDEPENDENT_AMBULATORY_CARE_PROVIDER_SITE_OTHER): Payer: Self-pay | Admitting: Family Medicine

## 2022-06-07 DIAGNOSIS — K219 Gastro-esophageal reflux disease without esophagitis: Secondary | ICD-10-CM

## 2022-06-07 DIAGNOSIS — I10 Essential (primary) hypertension: Secondary | ICD-10-CM

## 2022-06-07 DIAGNOSIS — F320 Major depressive disorder, single episode, mild: Secondary | ICD-10-CM

## 2022-06-07 MED ORDER — OMEPRAZOLE 20 MG PO CPDR: 20.0000 mg | DELAYED_RELEASE_CAPSULE | Freq: Every day | ORAL | 3 refills | Status: DC

## 2022-06-07 MED ORDER — BUPROPION HCL ER (SR) 150 MG PO TB12: 150.0000 mg | ORAL_TABLET | Freq: Two times a day (BID) | ORAL | 3 refills | Status: DC

## 2022-06-07 MED ORDER — ATENOLOL 100 MG PO TABS: 100.0000 mg | ORAL_TABLET | Freq: Every day | ORAL | 3 refills | Status: DC

## 2022-06-07 MED ORDER — ENALAPRIL MALEATE 20 MG PO TABS: 20.0000 mg | ORAL_TABLET | Freq: Every day | ORAL | 3 refills | Status: DC

## 2022-06-07 NOTE — Progress Notes (Signed)
Virtual Visit via Telephone Note  I connected with [name] on 06/07/22 at  8:20 AM EDT by telephone and verified that I am speaking with the correct person using two identifiers.  Location: Patient: Home Provider: Home   I discussed the limitations, risks, security and privacy concerns of performing an evaluation and management service by telephone and the availability of in person appointments. I also discussed with the patient that there may be a patient responsible charge related to this service. The patient expressed understanding and agreed to proceed.   History of Present Illness: 46 year old female with HTN, GERD, Depression presents for follow up.    Patient reports that her hypertension is well controlled.  Home BP readings are stable.  She is compliant with atenolol and enalapril.  Needs refill.  GERD is stable on omeprazole.  Needs refill.  Depression is stable as well.  She is doing well on Wellbutrin.  Needs refill.  Patient states that she is feeling well.  She has no other complaints or concerns at this time.  She does note that she is now without health insurance.  Observations/Objective: Normal speech.  No evidence of respiratory distress. Normal mood.  Assessment and Plan: HTN Stable. Continue Atenolol and Enalapril. Refilled today.  GERD Stable. Continue Omeprazole. Refilled today.  Depression Stable on Wellbutrin. Continue. Refilled today.   Follow Up Instructions:    I discussed the assessment and treatment plan with the patient. The patient was provided an opportunity to ask questions and all were answered. The patient agreed with the plan and demonstrated an understanding of the instructions.   The patient was advised to call back or seek an in-person evaluation if the symptoms worsen or if the condition fails to improve as anticipated.  I provided <5 minutes of non-face-to-face time during this encounter.   Tommie Sams, DO

## 2023-05-05 ENCOUNTER — Ambulatory Visit (INDEPENDENT_AMBULATORY_CARE_PROVIDER_SITE_OTHER): Payer: BC Managed Care – PPO | Admitting: Family Medicine

## 2023-05-05 VITALS — BP 118/76 | HR 61 | Temp 98.1°F | Ht 64.0 in | Wt 165.6 lb

## 2023-05-05 DIAGNOSIS — R079 Chest pain, unspecified: Secondary | ICD-10-CM

## 2023-05-05 DIAGNOSIS — K219 Gastro-esophageal reflux disease without esophagitis: Secondary | ICD-10-CM

## 2023-05-05 DIAGNOSIS — F320 Major depressive disorder, single episode, mild: Secondary | ICD-10-CM

## 2023-05-05 DIAGNOSIS — E785 Hyperlipidemia, unspecified: Secondary | ICD-10-CM | POA: Diagnosis not present

## 2023-05-05 DIAGNOSIS — I10 Essential (primary) hypertension: Secondary | ICD-10-CM

## 2023-05-05 MED ORDER — ENALAPRIL MALEATE 20 MG PO TABS: 20.0000 mg | ORAL_TABLET | Freq: Every day | ORAL | 3 refills | Status: DC

## 2023-05-05 MED ORDER — OMEPRAZOLE 20 MG PO CPDR: 20.0000 mg | DELAYED_RELEASE_CAPSULE | Freq: Every day | ORAL | 3 refills | Status: DC

## 2023-05-05 MED ORDER — ATENOLOL 100 MG PO TABS: 100.0000 mg | ORAL_TABLET | Freq: Every day | ORAL | 3 refills | Status: DC

## 2023-05-05 MED ORDER — BUPROPION HCL ER (SR) 150 MG PO TB12: 150.0000 mg | ORAL_TABLET | Freq: Two times a day (BID) | ORAL | 3 refills | Status: DC

## 2023-05-05 NOTE — Patient Instructions (Signed)
Get labs drawn.  Will arrange referral to cardiology just to be on the safe side.  Continue your medications.

## 2023-05-06 DIAGNOSIS — R079 Chest pain, unspecified: Secondary | ICD-10-CM | POA: Insufficient documentation

## 2023-05-06 MED ORDER — TIRZEPATIDE-WEIGHT MANAGEMENT 2.5 MG/0.5ML ~~LOC~~ SOAJ
2.5000 mg | SUBCUTANEOUS | 0 refills | Status: DC
Start: 2023-05-06 — End: 2023-06-25

## 2023-05-06 NOTE — Assessment & Plan Note (Signed)
Continue omeprazole 

## 2023-05-06 NOTE — Assessment & Plan Note (Signed)
I feel that this is indeed musculoskeletal especially given the involvement of the upper back/periscapular region.  EKG normal today.  Patient does have hypertension and obesity as well as hyperlipidemia.  Referring to cardiology.

## 2023-05-06 NOTE — Assessment & Plan Note (Signed)
Wellbutrin refilled

## 2023-05-06 NOTE — Progress Notes (Signed)
Subjective:  Patient ID: Melanie Clark, female    DOB: [birth year]  Age: 47 y.o. MRN: [id number]  CC: Chief Complaint  Patient presents with   Chest Pain    PT states that the chest pain has been going on for about 6 months, pt describes this as just a discomfort pain.    HPI:  47 year old female with hypertension, GERD, and depression presents for follow-up.  Patient's hypertension is well-controlled on atenolol and enalapril.  GERD stable on omeprazole.  Depression stable on Wellbutrin.  Patient reports intermittent left-sided chest pain over the past 6 months.  Patient states that seems to affect the left upper back around the scapula as well.  Patient states that she does not feel like this is musculoskeletal.  No associated shortness of breath.  No reports of diaphoresis.  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  She would like to switch to Zepbound.  She is requesting a prescription for this today.  Patient Active Problem List   Diagnosis Date Noted   Chest pain 05/06/2023   GERD (gastroesophageal reflux disease) 12/06/2021   Depression, major, single episode, mild (HCC) 11/17/2017   Essential hypertension, benign 01/06/2013    Social Hx   Social History   Socioeconomic History   Marital status: Unknown    Spouse name: Not on file   Number of children: Not on file   Years of education: Not on file   Highest education level: Not on file  Occupational History   Not on file  Tobacco Use   Smoking status: Never   Smokeless tobacco: Never  Substance and Sexual Activity   Alcohol use: Yes    Comment: social   Drug use: No   Sexual activity: Not on file  Other Topics Concern   Not on file  Social History Narrative   Not on file   Social Determinants of Health   Financial Resource Strain: Not on file  Food Insecurity: Not on file  Transportation Needs: Not on file  Physical Activity: Not on file  Stress:  Not on file  Social Connections: Not on file    Review of Systems Per HPI  Objective:  BP 118/76   Pulse 61   Temp 98.1 F (36.7 C)   Ht 5\' 4"  (1.626 m)   Wt 165 lb 9.6 oz (75.1 kg)   SpO2 100%   BMI 28.43 kg/m      05/05/2023   11:48 AM 12/06/2021    8:19 AM 04/12/2021    2:54 PM  BP/Weight  Systolic BP 118 133 118  Diastolic BP 76 76 85  Wt. (Lbs) 165.6 140.2 173.6  BMI 28.43 kg/m2 24.07 kg/m2 29.8 kg/m2    Physical Exam Vitals and nursing note reviewed.  Constitutional:      General: She is not in acute distress.    Appearance: Normal appearance.  HENT:     Head: Normocephalic and atraumatic.  Eyes:     General:        Right eye: No discharge.        Left eye: No discharge.     Conjunctiva/sclera: Conjunctivae normal.  Cardiovascular:     Rate and Rhythm: Normal rate and regular rhythm.  Pulmonary:     Effort: Pulmonary effort is normal.     Breath sounds: Normal breath sounds. No wheezing, rhonchi or rales.  Neurological:     Mental Status: She is alert.  Psychiatric:  Mood and Affect: Mood normal.        Behavior: Behavior normal.     Lab Results  Component Value Date   WBC 10.0 10/26/2019   HGB 13.0 10/26/2019   HCT 39.0 10/26/2019   PLT 370 10/26/2019   GLUCOSE 78 10/26/2019   CHOL 200 (H) 10/26/2019   TRIG 94 10/26/2019   HDL 55 10/26/2019   LDLCALC 128 (H) 10/26/2019   ALT 23 10/26/2019   AST 22 10/26/2019   NA 140 10/26/2019   K 4.7 10/26/2019   CL 101 10/26/2019   CREATININE 0.71 10/26/2019   BUN 8 10/26/2019   CO2 25 10/26/2019   TSH 3.080 10/26/2019   EKG: Normal sinus rhythm with a rate of 65.  Normal axis.  Normal intervals.  No ST-T wave changes.  Normal EKG.  Assessment & Plan:   Problem List Items Addressed This Visit       Cardiovascular and Mediastinum   Essential hypertension, benign    BP well-controlled.  Continue current medications.  Medications refilled today.      Relevant Medications   enalapril  (VASOTEC) 20 MG tablet   atenolol (TENORMIN) 100 MG tablet     Digestive   GERD (gastroesophageal reflux disease)    Continue omeprazole.      Relevant Medications   omeprazole (PRILOSEC) 20 MG capsule     Other   Depression, major, single episode, mild (HCC)    Wellbutrin refilled.      Relevant Medications   buPROPion (WELLBUTRIN SR) 150 MG 12 hr tablet   Chest pain - Primary    I feel that this is indeed musculoskeletal especially given the involvement of the upper back/periscapular region.  EKG normal today.  Patient does have hypertension and obesity as well as hyperlipidemia.  Referring to cardiology.      Relevant Orders   CBC   CMP14+EGFR   EKG 12-Lead (Completed)   Other Visit Diagnoses     Hyperlipidemia, unspecified hyperlipidemia type       Relevant Medications   enalapril (VASOTEC) 20 MG tablet   atenolol (TENORMIN) 100 MG tablet   Other Relevant Orders   Lipid panel       Meds ordered this encounter  Medications   enalapril (VASOTEC) 20 MG tablet    Sig: Take 1 tablet (20 mg total) by mouth daily.    Dispense:  90 tablet    Refill:  3   buPROPion (WELLBUTRIN SR) 150 MG 12 hr tablet    Sig: Take 1 tablet (150 mg total) by mouth 2 (two) times daily.    Dispense:  180 tablet    Refill:  3   omeprazole (PRILOSEC) 20 MG capsule    Sig: Take 1 capsule (20 mg total) by mouth daily.    Dispense:  90 capsule    Refill:  3   atenolol (TENORMIN) 100 MG tablet    Sig: Take 1 tablet (100 mg total) by mouth daily.    Dispense:  90 tablet    Refill:  3   tirzepatide (ZEPBOUND) 2.5 MG/0.5ML Pen    Sig: Inject 2.5 mg into the skin once a week.    Dispense:  2 mL    Refill:  0    Follow-up:  Return in about 6 months (around 11/05/2023).  Everlene Other DO [facility]

## 2023-05-06 NOTE — Assessment & Plan Note (Signed)
BP well-controlled.  Continue current medications.  Medications refilled today.

## 2023-06-25 ENCOUNTER — Telehealth: Payer: Self-pay

## 2023-06-25 ENCOUNTER — Other Ambulatory Visit: Payer: Self-pay | Admitting: Family Medicine

## 2023-06-25 MED ORDER — TIRZEPATIDE-WEIGHT MANAGEMENT 2.5 MG/0.5ML ~~LOC~~ SOAJ: 2.5000 mg | SUBCUTANEOUS | 0 refills | Status: DC

## 2023-06-25 NOTE — Telephone Encounter (Addendum)
Zepbound is pended in the message for you to approve

## 2023-07-09 LAB — LIPID PANEL
Chol/HDL Ratio: 3.5 {ratio} (ref 0.0–4.4)
Cholesterol, Total: 209 mg/dL — ABNORMAL HIGH (ref 100–199)
HDL: 59 mg/dL (ref >39)
LDL Chol Calc (NIH): 130 mg/dL — ABNORMAL HIGH (ref 0–99)
Triglycerides: 113 mg/dL (ref 0–149)
VLDL Cholesterol Cal: 20 mg/dL (ref 5–40)

## 2023-07-09 LAB — CMP14+EGFR
ALT: 20 [IU]/L (ref 0–32)
AST: 23 [IU]/L (ref 0–40)
Albumin: 4.4 g/dL (ref 3.9–4.9)
Alkaline Phosphatase: 68 [IU]/L (ref 44–121)
BUN/Creatinine Ratio: 15 (ref 9–23)
BUN: 12 mg/dL (ref 6–24)
Bilirubin Total: 0.3 mg/dL (ref 0.0–1.2)
CO2: 23 mmol/L (ref 20–29)
Calcium: 9.6 mg/dL (ref 8.7–10.2)
Chloride: 103 mmol/L (ref 96–106)
Creatinine, Ser: 0.78 mg/dL (ref 0.57–1.00)
Globulin, Total: 2.1 g/dL (ref 1.5–4.5)
Glucose: 74 mg/dL (ref 70–99)
Potassium: 4.5 mmol/L (ref 3.5–5.2)
Sodium: 141 mmol/L (ref 134–144)
Total Protein: 6.5 g/dL (ref 6.0–8.5)
eGFR: 93 mL/min/{1.73_m2} (ref >59)

## 2023-07-09 LAB — CBC
Hematocrit: 40.5 % (ref 34.0–46.6)
Hemoglobin: 13.2 g/dL (ref 11.1–15.9)
MCH: 30 pg (ref 26.6–33.0)
MCHC: 32.6 g/dL (ref 31.5–35.7)
MCV: 92 fL (ref 79–97)
Platelets: 331 10*3/uL (ref 150–450)
RBC: 4.4 x10E6/uL (ref 3.77–5.28)
RDW: 11.5 % — ABNORMAL LOW (ref 11.7–15.4)
WBC: 6.4 10*3/uL (ref 3.4–10.8)

## 2023-07-24 ENCOUNTER — Telehealth: Payer: Self-pay | Admitting: Family Medicine

## 2023-07-24 DIAGNOSIS — Z0279 Encounter for issue of other medical certificate: Secondary | ICD-10-CM

## 2023-07-24 NOTE — Telephone Encounter (Signed)
Patient brought by form to be completed in your folder.

## 2023-07-25 ENCOUNTER — Encounter: Payer: Self-pay | Admitting: Family Medicine

## 2023-07-27 ENCOUNTER — Other Ambulatory Visit: Payer: Self-pay | Admitting: Family Medicine

## 2023-08-11 ENCOUNTER — Telehealth: Payer: Self-pay

## 2023-08-11 ENCOUNTER — Other Ambulatory Visit: Payer: Self-pay

## 2023-08-11 MED ORDER — TIRZEPATIDE-WEIGHT MANAGEMENT 2.5 MG/0.5ML ~~LOC~~ SOAJ: 2.5000 mg | SUBCUTANEOUS | 0 refills | Status: DC

## 2023-08-11 NOTE — Telephone Encounter (Signed)
Pt called in for Refill on Zepbound. Refilled 08/11/2023 at 11:57Am

## 2023-08-18 ENCOUNTER — Other Ambulatory Visit: Payer: Self-pay

## 2023-08-18 ENCOUNTER — Encounter (HOSPITAL_COMMUNITY): Payer: Self-pay | Admitting: Emergency Medicine

## 2023-08-18 ENCOUNTER — Emergency Department (HOSPITAL_COMMUNITY)
Admission: EM | Admit: 2023-08-18 | Discharge: 2023-08-19 | Disposition: A | Discharge status: Home / Self Care | Payer: BC Managed Care – PPO | Attending: Emergency Medicine | Admitting: Emergency Medicine

## 2023-08-18 ENCOUNTER — Emergency Department (HOSPITAL_COMMUNITY): Payer: BC Managed Care – PPO

## 2023-08-18 DIAGNOSIS — I10 Essential (primary) hypertension: Secondary | ICD-10-CM | POA: Insufficient documentation

## 2023-08-18 DIAGNOSIS — K529 Noninfective gastroenteritis and colitis, unspecified: Secondary | ICD-10-CM | POA: Insufficient documentation

## 2023-08-18 DIAGNOSIS — R1013 Epigastric pain: Secondary | ICD-10-CM | POA: Diagnosis present

## 2023-08-18 LAB — COMPREHENSIVE METABOLIC PANEL
ALT: 22 U/L (ref 0–44)
AST: 23 U/L (ref 15–41)
Albumin: 4.3 g/dL (ref 3.5–5.0)
Alkaline Phosphatase: 59 U/L (ref 38–126)
Anion gap: 12 (ref 5–15)
BUN: 11 mg/dL (ref 6–20)
CO2: 23 mmol/L (ref 22–32)
Calcium: 9.3 mg/dL (ref 8.9–10.3)
Chloride: 102 mmol/L (ref 98–111)
Creatinine, Ser: 0.78 mg/dL (ref 0.44–1.00)
GFR, Estimated: >60 mL/min (ref >60)
Glucose, Bld: 86 mg/dL (ref 70–99)
Potassium: 3.7 mmol/L (ref 3.5–5.1)
Sodium: 137 mmol/L (ref 135–145)
Total Bilirubin: 0.7 mg/dL (ref <1.2)
Total Protein: 7.4 g/dL (ref 6.5–8.1)

## 2023-08-18 LAB — CBC WITH DIFFERENTIAL/PLATELET
Abs Immature Granulocytes: 0.04 10*3/uL (ref 0.00–0.07)
Basophils Absolute: 0 10*3/uL (ref 0.0–0.1)
Basophils Relative: 0 %
Eosinophils Absolute: 0.1 10*3/uL (ref 0.0–0.5)
Eosinophils Relative: 1 %
HCT: 43.3 % (ref 36.0–46.0)
Hemoglobin: 14.6 g/dL (ref 12.0–15.0)
Immature Granulocytes: 0 %
Lymphocytes Relative: 10 %
Lymphs Abs: 1.2 10*3/uL (ref 0.7–4.0)
MCH: 30.3 pg (ref 26.0–34.0)
MCHC: 33.7 g/dL (ref 30.0–36.0)
MCV: 89.8 fL (ref 80.0–100.0)
Monocytes Absolute: 0.6 10*3/uL (ref 0.1–1.0)
Monocytes Relative: 5 %
Neutro Abs: 10.2 10*3/uL — ABNORMAL HIGH (ref 1.7–7.7)
Neutrophils Relative %: 84 %
Platelets: 352 10*3/uL (ref 150–400)
RBC: 4.82 MIL/uL (ref 3.87–5.11)
RDW: 11.9 % (ref 11.5–15.5)
WBC: 12.1 10*3/uL — ABNORMAL HIGH (ref 4.0–10.5)
nRBC: 0 % (ref 0.0–0.2)

## 2023-08-18 LAB — URINALYSIS, ROUTINE W REFLEX MICROSCOPIC
Bilirubin Urine: NEGATIVE
Glucose, UA: NEGATIVE mg/dL
Hgb urine dipstick: NEGATIVE
Ketones, ur: 20 mg/dL — AB
Leukocytes,Ua: NEGATIVE
Nitrite: NEGATIVE
Protein, ur: NEGATIVE mg/dL
Specific Gravity, Urine: 1.015 (ref 1.005–1.030)
pH: 6 (ref 5.0–8.0)

## 2023-08-18 LAB — LIPASE, BLOOD: Lipase: 32 U/L (ref 11–51)

## 2023-08-18 LAB — HCG, SERUM, QUALITATIVE: Preg, Serum: NEGATIVE

## 2023-08-18 MED ORDER — HYDROCODONE-ACETAMINOPHEN 5-325 MG PO TABS
2.0000 | ORAL_TABLET | Freq: Once | ORAL | Status: AC
  Administered 2023-08-18: 2 via ORAL
  Filled 2023-08-18: qty 2

## 2023-08-18 MED ORDER — IOHEXOL 350 MG/ML SOLN: 75.0000 mL | Freq: Once | INTRAVENOUS | Status: DC | PRN

## 2023-08-18 MED ORDER — FENTANYL CITRATE PF 50 MCG/ML IJ SOSY
50.0000 ug | PREFILLED_SYRINGE | Freq: Once | INTRAMUSCULAR | Status: AC
  Administered 2023-08-18: 50 ug via INTRAVENOUS
  Filled 2023-08-18: qty 1

## 2023-08-18 MED ORDER — PROCHLORPERAZINE EDISYLATE 10 MG/2ML IJ SOLN
5.0000 mg | Freq: Once | INTRAMUSCULAR | Status: AC
  Administered 2023-08-18: 5 mg via INTRAVENOUS
  Filled 2023-08-18: qty 2

## 2023-08-18 MED ORDER — ONDANSETRON 4 MG PO TBDP
4.0000 mg | ORAL_TABLET | Freq: Once | ORAL | Status: AC
  Administered 2023-08-18: 4 mg via ORAL
  Filled 2023-08-18: qty 1

## 2023-08-18 MED ORDER — IOHEXOL 350 MG/ML SOLN
75.0000 mL | Freq: Once | INTRAVENOUS | Status: AC | PRN
  Administered 2023-08-18: 75 mL via INTRAVENOUS

## 2023-08-18 NOTE — ED Triage Notes (Signed)
Pt reports upper mid abdominal pain that started this morning. Pt also reports n/v.

## 2023-08-18 NOTE — ED Provider Triage Note (Signed)
Emergency Medicine Provider Triage Evaluation Note  Melanie Clark , a 46 y.o. female  was evaluated in triage.  Pt complains of onset of epigastric and right upper quadrant abdominal pain.  Patient states that the pain is severe and began last night.  She took her first shot of terms appetite yesterday.  She has severe nausea without vomiting..  Review of Systems  Positive: B gastric pain Negative: Fever  Physical Exam  There were no vitals taken for this visit. Gen:   Awake, no distress   Resp:  Normal effort  MSK:   Moves extremities without difficulty  Other:  Quizzically tender in the right upper quadrant of the abdomen  Medical Decision Making  Medically screening exam initiated at 5:13 PM.  Appropriate orders placed.  Melanie Clark was informed that the remainder of the evaluation will be completed by another provider, this initial triage assessment does not replace that evaluation, and the importance of remaining in the ED until their evaluation is complete.     Arthor Captain, PA-C 08/18/23 1714

## 2023-08-18 NOTE — ED Notes (Signed)
In 2B

## 2023-08-19 ENCOUNTER — Other Ambulatory Visit: Payer: Self-pay | Admitting: Family Medicine

## 2023-08-19 MED ORDER — ONDANSETRON 4 MG PO TBDP: 4.0000 mg | ORAL_TABLET | Freq: Three times a day (TID) | ORAL | 0 refills | Status: DC | PRN

## 2023-08-19 MED ORDER — AMOXICILLIN-POT CLAVULANATE 875-125 MG PO TABS: 1.0000 | ORAL_TABLET | Freq: Two times a day (BID) | ORAL | 0 refills | Status: AC

## 2023-08-19 MED ORDER — FLUCONAZOLE 150 MG PO TABS: 150.0000 mg | ORAL_TABLET | ORAL | 0 refills | Status: DC | PRN

## 2023-08-19 MED ORDER — AMOXICILLIN-POT CLAVULANATE 875-125 MG PO TABS
1.0000 | ORAL_TABLET | Freq: Once | ORAL | Status: AC
  Administered 2023-08-19: 1 via ORAL
  Filled 2023-08-19: qty 1

## 2023-08-19 MED ORDER — DICYCLOMINE HCL 20 MG PO TABS: 20.0000 mg | ORAL_TABLET | Freq: Two times a day (BID) | ORAL | 0 refills | Status: DC

## 2023-08-19 MED ORDER — AMOXICILLIN-POT CLAVULANATE 875-125 MG PO TABS: 1.0000 | ORAL_TABLET | Freq: Two times a day (BID) | ORAL | 0 refills | Status: DC

## 2023-08-19 NOTE — ED Provider Notes (Signed)
[facility] Emergency Department Provider Note MRN:  [id number]  Arrival date & time: 08/19/23     Chief Complaint   Abdominal Pain   History of Present Illness   Melanie Clark is a 47 y.o. year-old female with a history of hypertension, GERD presenting to the ED with chief complaint of abdominal pain.  Epigastric abdominal pain becoming more diffuse over the past few days.  Associated with nausea.  Pain when go away.  Bloody stools recently that she attributes to straining.  Review of Systems  A thorough review of systems was obtained and all systems are negative except as noted in the HPI and PMH.   Patient's Health History    Past Medical History:  Diagnosis Date   GERD (gastroesophageal reflux disease)    Hypertension     Past Surgical History:  Procedure Laterality Date   CESAREAN SECTION      Family History  Problem Relation Age of Onset   Hypertension Mother    Hypertension Father    Diabetes Other    Hypertension Other    Cancer Other    Hypertension Other    Hypertension Paternal Grandmother    Hypertension Paternal Grandfather     Social History   Socioeconomic History   Marital status: Unknown    Spouse name: Not on file   Number of children: Not on file   Years of education: Not on file   Highest education level: Not on file  Occupational History   Not on file  Tobacco Use   Smoking status: Never   Smokeless tobacco: Never  Substance and Sexual Activity   Alcohol use: Yes    Comment: social   Drug use: No   Sexual activity: Not on file  Other Topics Concern   Not on file  Social History Narrative   Not on file   Social Determinants of Health   Financial Resource Strain: Not on file  Food Insecurity: Not on file  Transportation Needs: Not on file  Physical Activity: Not on file  Stress: Not on file  Social Connections: Not on file  Intimate Partner Violence: Not on file     Physical Exam   Vitals:    08/19/23 0415 08/19/23 0500  BP: 130/88 133/84  Pulse: 72 70  Resp: 18   Temp: 97.9 F (36.6 C)   SpO2: 100% 100%    CONSTITUTIONAL: Well-appearing, NAD NEURO/PSYCH:  Alert and oriented x 3, no focal deficits EYES:  eyes equal and reactive ENT/NECK:  no LAD, no JVD CARDIO: Regular rate, well-perfused, normal S1 and S2 PULM:  CTAB no wheezing or rhonchi GI/GU:  non-distended, non-tender MSK/SPINE:  No gross deformities, no edema SKIN:  no rash, atraumatic   *Additional and/or pertinent findings included in MDM below  Diagnostic and Interventional Summary    EKG Interpretation Date/Time:    Ventricular Rate:    PR Interval:    QRS Duration:    QT Interval:    QTC Calculation:   R Axis:      Text Interpretation:         Labs Reviewed  CBC WITH DIFFERENTIAL/PLATELET - Abnormal; Notable for the following components:      Result Value   WBC 12.1 (*)    Neutro Abs 10.2 (*)    All other components within normal limits  URINALYSIS, ROUTINE W REFLEX MICROSCOPIC - Abnormal; Notable for the following components:   Ketones, ur 20 (*)    All other components  within normal limits  COMPREHENSIVE METABOLIC PANEL  LIPASE, BLOOD  HCG, SERUM, QUALITATIVE    CT ABDOMEN PELVIS W CONTRAST  Final Result    US ABDOMEN LIMITED RUQ (LIVER/GB)  Final Result      Medications  iohexol (OMNIPAQUE) 350 MG/ML injection 75 mL (has no administration in time range)  amoxicillin-clavulanate (AUGMENTIN) 875-125 MG per tablet 1 tablet (has no administration in time range)  ondansetron (ZOFRAN-ODT) disintegrating tablet 4 mg (4 mg Oral Given 08/18/23 1718)  HYDROcodone-acetaminophen (NORCO/VICODIN) 5-325 MG per tablet 2 tablet (2 tablets Oral Given 08/18/23 1718)  prochlorperazine (COMPAZINE) injection 5 mg (5 mg Intravenous Given 08/18/23 1805)  fentaNYL (SUBLIMAZE) injection 50 mcg (50 mcg Intravenous Given 08/18/23 1805)  iohexol (OMNIPAQUE) 350 MG/ML injection 75 mL (75 mLs Intravenous  Contrast Given 08/18/23 2045)     Procedures  /  Critical Care Procedures  ED Course and Medical Decision Making  Initial Impression and Ddx Differential diagnosis includes colitis, diverticulitis, appendicitis, gastritis, cholecystitis  Past medical/surgical history that increases complexity of ED encounter: Hypertension  Interpretation of Diagnostics I personally reviewed the laboratory assessment and my interpretation is as follows: No significant blood count or electrolyte disturbance  Ultrasound of gallbladder is normal.  CT revealing colitis.  Patient Reassessment and Ultimate Disposition/Management     The circumferential colitis and the bloody stool raises some consideration for inflammatory bowel disease.  Patient denies any history of this.  She feels better and wants to go home.  Providing with Augmentin to treat for any possible bacterial cause of the colitis, strict return precautions, strongly encouraged PCP follow-up to have them review the testing today and possibility of IBD.  Patient management required discussion with the following services or consulting groups:  None  Complexity of Problems Addressed Acute illness or injury that poses threat of life of bodily function  Additional Data Reviewed and Analyzed Further history obtained from: Prior labs/imaging results  Additional Factors Impacting ED Encounter Risk Prescriptions and Consideration of hospitalization  Elmer Sow. Pilar Plate, MD [facility] Emergency Medicine [facility] [email or web address]  Final Clinical Impressions(s) / ED Diagnoses     ICD-10-CM   1. Colitis  K52.9       ED Discharge Orders          Ordered    amoxicillin-clavulanate (AUGMENTIN) 875-125 MG tablet  Every 12 hours        08/19/23 0508    dicyclomine (BENTYL) 20 MG tablet  2 times daily        08/19/23 0508    ondansetron (ZOFRAN-ODT) 4 MG disintegrating tablet  Every 8 hours PRN        08/19/23 0508              Discharge Instructions Discussed with and Provided to Patient:    Discharge Instructions      You were evaluated in the Emergency Department and after careful evaluation, we did not find any emergent condition requiring admission or further testing in the hospital.  Your exam/testing today is overall reassuring.  Symptoms seem to be due to a colitis.  Take the Augmentin antibiotic as directed.  Use the Zofran as needed for nausea, use the Bentyl as needed for crampy abdominal pain.  Follow-up with your primary care doctor as we discussed.  Please return to the Emergency Department if you experience any worsening of your condition.   Thank you for allowing [place] to be a part of your care.      Jong Rickman,  Elmer Sow, MD 08/19/23 [phone]

## 2023-08-19 NOTE — Discharge Instructions (Signed)
You were evaluated in the Emergency Department and after careful evaluation, we did not find any emergent condition requiring admission or further testing in the hospital.  Your exam/testing today is overall reassuring.  Symptoms seem to be due to a colitis.  Take the Augmentin antibiotic as directed.  Use the Zofran as needed for nausea, use the Bentyl as needed for crampy abdominal pain.  Follow-up with your primary care doctor as we discussed.  Please return to the Emergency Department if you experience any worsening of your condition.   Thank you for allowing [place] to be a part of your care.

## 2023-08-20 ENCOUNTER — Other Ambulatory Visit: Payer: Self-pay | Admitting: Family Medicine

## 2023-08-20 MED ORDER — TIRZEPATIDE-WEIGHT MANAGEMENT 5 MG/0.5ML ~~LOC~~ SOLN: 5.0000 mg | SUBCUTANEOUS | 0 refills | Status: DC

## 2023-08-21 ENCOUNTER — Other Ambulatory Visit: Payer: Self-pay | Admitting: Family Medicine

## 2023-09-08 ENCOUNTER — Encounter (HOSPITAL_BASED_OUTPATIENT_CLINIC_OR_DEPARTMENT_OTHER): Payer: Self-pay | Admitting: Emergency Medicine

## 2023-09-08 ENCOUNTER — Emergency Department (HOSPITAL_BASED_OUTPATIENT_CLINIC_OR_DEPARTMENT_OTHER)
Admission: EM | Admit: 2023-09-08 | Discharge: 2023-09-08 | Disposition: A | Discharge status: Home / Self Care | Payer: BC Managed Care – PPO | Attending: Emergency Medicine | Admitting: Emergency Medicine

## 2023-09-08 ENCOUNTER — Emergency Department (HOSPITAL_BASED_OUTPATIENT_CLINIC_OR_DEPARTMENT_OTHER): Payer: BC Managed Care – PPO

## 2023-09-08 ENCOUNTER — Other Ambulatory Visit: Payer: Self-pay

## 2023-09-08 DIAGNOSIS — R1110 Vomiting, unspecified: Secondary | ICD-10-CM | POA: Diagnosis not present

## 2023-09-08 DIAGNOSIS — R1013 Epigastric pain: Secondary | ICD-10-CM | POA: Diagnosis not present

## 2023-09-08 DIAGNOSIS — R1011 Right upper quadrant pain: Secondary | ICD-10-CM | POA: Insufficient documentation

## 2023-09-08 DIAGNOSIS — Z794 Long term (current) use of insulin: Secondary | ICD-10-CM | POA: Insufficient documentation

## 2023-09-08 DIAGNOSIS — R197 Diarrhea, unspecified: Secondary | ICD-10-CM | POA: Diagnosis not present

## 2023-09-08 LAB — URINALYSIS, ROUTINE W REFLEX MICROSCOPIC
Bilirubin Urine: NEGATIVE
Glucose, UA: NEGATIVE mg/dL
Hgb urine dipstick: NEGATIVE
Ketones, ur: >80 mg/dL — AB
Nitrite: NEGATIVE
Protein, ur: 30 mg/dL — AB
Specific Gravity, Urine: 1.034 — ABNORMAL HIGH (ref 1.005–1.030)
pH: 6.5 (ref 5.0–8.0)

## 2023-09-08 LAB — CBC
HCT: 41.2 % (ref 36.0–46.0)
Hemoglobin: 14.7 g/dL (ref 12.0–15.0)
MCH: 30.5 pg (ref 26.0–34.0)
MCHC: 35.7 g/dL (ref 30.0–36.0)
MCV: 85.5 fL (ref 80.0–100.0)
Platelets: 217 10*3/uL (ref 150–400)
RBC: 4.82 MIL/uL (ref 3.87–5.11)
RDW: 11.8 % (ref 11.5–15.5)
WBC: 3.8 10*3/uL — ABNORMAL LOW (ref 4.0–10.5)
nRBC: 0 % (ref 0.0–0.2)

## 2023-09-08 LAB — COMPREHENSIVE METABOLIC PANEL
ALT: 30 U/L (ref 0–44)
AST: 36 U/L (ref 15–41)
Albumin: 4.2 g/dL (ref 3.5–5.0)
Alkaline Phosphatase: 58 U/L (ref 38–126)
Anion gap: 10 (ref 5–15)
BUN: 17 mg/dL (ref 6–20)
CO2: 26 mmol/L (ref 22–32)
Calcium: 9.4 mg/dL (ref 8.9–10.3)
Chloride: 92 mmol/L — ABNORMAL LOW (ref 98–111)
Creatinine, Ser: 0.77 mg/dL (ref 0.44–1.00)
GFR, Estimated: >60 mL/min (ref >60)
Glucose, Bld: 107 mg/dL — ABNORMAL HIGH (ref 70–99)
Potassium: 4.6 mmol/L (ref 3.5–5.1)
Sodium: 128 mmol/L — ABNORMAL LOW (ref 135–145)
Total Bilirubin: 0.5 mg/dL (ref <1.2)
Total Protein: 7.1 g/dL (ref 6.5–8.1)

## 2023-09-08 LAB — LIPASE, BLOOD: Lipase: 37 U/L (ref 11–51)

## 2023-09-08 LAB — PREGNANCY, URINE: Preg Test, Ur: NEGATIVE

## 2023-09-08 MED ORDER — IOHEXOL 300 MG/ML  SOLN
100.0000 mL | Freq: Once | INTRAMUSCULAR | Status: AC | PRN
  Administered 2023-09-08: 100 mL via INTRAVENOUS

## 2023-09-08 MED ORDER — PROCHLORPERAZINE EDISYLATE 10 MG/2ML IJ SOLN
10.0000 mg | Freq: Once | INTRAMUSCULAR | Status: AC
  Administered 2023-09-08: 10 mg via INTRAVENOUS
  Filled 2023-09-08: qty 2

## 2023-09-08 MED ORDER — OXYCODONE-ACETAMINOPHEN 5-325 MG PO TABS
1.0000 | ORAL_TABLET | Freq: Once | ORAL | Status: AC
  Administered 2023-09-08: 1 via ORAL
  Filled 2023-09-08: qty 1

## 2023-09-08 MED ORDER — OXYCODONE-ACETAMINOPHEN 5-325 MG PO TABS: 2.0000 | ORAL_TABLET | ORAL | 0 refills | Status: DC | PRN

## 2023-09-08 MED ORDER — ONDANSETRON 4 MG PO TBDP
4.0000 mg | ORAL_TABLET | Freq: Once | ORAL | Status: AC | PRN
  Administered 2023-09-08: 4 mg via ORAL
  Filled 2023-09-08: qty 1

## 2023-09-08 MED ORDER — LACTATED RINGERS IV BOLUS
1000.0000 mL | Freq: Once | INTRAVENOUS | Status: AC
Start: 2023-09-08 — End: 2023-09-08
  Administered 2023-09-08: 1000 mL via INTRAVENOUS

## 2023-09-08 MED ORDER — FENTANYL CITRATE PF 50 MCG/ML IJ SOSY
50.0000 ug | PREFILLED_SYRINGE | Freq: Once | INTRAMUSCULAR | Status: AC
  Administered 2023-09-08: 50 ug via INTRAVENOUS
  Filled 2023-09-08: qty 1

## 2023-09-08 MED ORDER — PROMETHAZINE HCL 25 MG RE SUPP: 25.0000 mg | Freq: Four times a day (QID) | RECTAL | 0 refills | Status: DC | PRN

## 2023-09-08 MED ORDER — PROMETHAZINE HCL 25 MG PO TABS: 25.0000 mg | ORAL_TABLET | Freq: Four times a day (QID) | ORAL | 0 refills | Status: DC | PRN

## 2023-09-08 NOTE — ED Provider Notes (Signed)
[place] EMERGENCY DEPARTMENT AT [facility] Provider Note   CSN: [id number] Arrival date & time: 09/08/23  4098     History  No chief complaint on file.   Melanie Clark is a 47 y.o. female.  History of colitis about a month ago was on antibiotics and cleared up after couple days.  Asymptomatic until tonight when she had acute onset of right upper quadrant pain similar to then but may be little bit worse.  Had some nonbloody nonbilious emesis with it.  She thought it might be recurrent colitis so she presents here for further evaluation.  Of note she did have right sided pain with her left-sided colitis last time.  She has not seen a GI doctor since then.  She is never had a colonoscopy.  No fevers with this pain.  She has had some diarrhea but no blood in it like last time.  No other associated symptoms.  She states she did start Zepbound and her last dose was 8 days ago.  No fevers.  No radiation of pain aside mildly to the epigastric area.        Home Medications Prior to Admission medications   Medication Sig Start Date End Date Taking? Authorizing Provider  oxyCODONE-acetaminophen (PERCOCET) 5-325 MG tablet Take 2 tablets by mouth every 4 (four) hours as needed. 09/08/23  Yes Kala Ambriz, Barbara Cower, MD  promethazine (PHENERGAN) 25 MG suppository Place 1 suppository (25 mg total) rectally every 6 (six) hours as needed for nausea or vomiting. 09/08/23  Yes Reuven Braver, Barbara Cower, MD  promethazine (PHENERGAN) 25 MG tablet Take 1 tablet (25 mg total) by mouth every 6 (six) hours as needed for nausea or vomiting. 09/08/23  Yes Lorence Nagengast, Barbara Cower, MD  atenolol (TENORMIN) 100 MG tablet Take 1 tablet (100 mg total) by mouth daily. 05/05/23   Tommie Sams, DO  buPROPion (WELLBUTRIN SR) 150 MG 12 hr tablet Take 1 tablet (150 mg total) by mouth 2 (two) times daily. 05/05/23   Tommie Sams, DO  dicyclomine (BENTYL) 20 MG tablet Take 1 tablet (20 mg total) by mouth 2 (two) times daily. 08/19/23   Sabas Sous, MD  enalapril (VASOTEC) 20 MG tablet Take 1 tablet (20 mg total) by mouth daily. 05/05/23   Tommie Sams, DO  fluconazole (DIFLUCAN) 150 MG tablet Take 1 tablet (150 mg total) by mouth every three (3) days as needed (yeast infection treatment/prevention). 08/19/23   Sabas Sous, MD  omeprazole (PRILOSEC) 20 MG capsule Take 1 capsule (20 mg total) by mouth daily. 05/05/23   Tommie Sams, DO  ondansetron (ZOFRAN-ODT) 4 MG disintegrating tablet Take 1 tablet (4 mg total) by mouth every 8 (eight) hours as needed for nausea or vomiting. 08/19/23   Sabas Sous, MD  tirzepatide 5 MG/0.5ML injection vial Inject 5 mg into the skin once a week. 08/20/23   Tommie Sams, DO      Allergies    Patient has no known allergies.    Review of Systems   Review of Systems  Physical Exam Updated Vital Signs BP 103/71   Pulse 81   Temp 97.6 F (36.4 C) (Oral)   Resp 17   Ht 5\' 4"  (1.626 m)   Wt 75.3 kg   SpO2 91%   BMI 28.49 kg/m  Physical Exam Vitals and nursing note reviewed.  Constitutional:      Appearance: She is well-developed.  HENT:     Head: Normocephalic and atraumatic.  Cardiovascular:     Rate and Rhythm: Normal rate and regular rhythm.  Pulmonary:     Effort: No respiratory distress.     Breath sounds: No stridor.  Abdominal:     General: There is no distension.     Tenderness: There is abdominal tenderness. There is no guarding or rebound.  Musculoskeletal:     Cervical back: Normal range of motion.  Neurological:     Mental Status: She is alert.     ED Results / Procedures / Treatments   Labs (all labs ordered are listed, but only abnormal results are displayed) Labs Reviewed  COMPREHENSIVE METABOLIC PANEL - Abnormal; Notable for the following components:      Result Value   Sodium 128 (*)    Chloride 92 (*)    Glucose, Bld 107 (*)    All other components within normal limits  CBC - Abnormal; Notable for the following components:   WBC 3.8 (*)     All other components within normal limits  URINALYSIS, ROUTINE W REFLEX MICROSCOPIC - Abnormal; Notable for the following components:   APPearance HAZY (*)    Specific Gravity, Urine 1.034 (*)    Ketones, ur >80 (*)    Protein, ur 30 (*)    Leukocytes,Ua MODERATE (*)    Bacteria, UA FEW (*)    All other components within normal limits  LIPASE, BLOOD  PREGNANCY, URINE    EKG None  Radiology CT ABDOMEN PELVIS W CONTRAST  Result Date: 09/08/2023 CLINICAL DATA:  Abdominal pain for several days, initial EXAM: CT ABDOMEN AND PELVIS WITH CONTRAST TECHNIQUE: Multidetector CT imaging of the abdomen and pelvis was performed using the standard protocol following bolus administration of intravenous contrast. RADIATION DOSE REDUCTION: This exam was performed according to the departmental dose-optimization program which includes automated exposure control, adjustment of the mA and/or kV according to patient size and/or use of iterative reconstruction technique. CONTRAST:  OMNIPAQUE IOHEXOL 300 MG/ML  SOLN COMPARISON:  08/18/2023 FINDINGS: Lower chest: No acute abnormality. Hepatobiliary: No focal liver abnormality is seen. No gallstones, gallbladder wall thickening, or biliary dilatation. Pancreas: Unremarkable. No pancreatic ductal dilatation or surrounding inflammatory changes. Spleen: Normal in size without focal abnormality. Adrenals/Urinary Tract: Adrenal glands are within normal limits. Kidneys demonstrate a normal enhancement pattern bilaterally. No renal calculi or obstructive changes are seen. The bladder is decompressed. Stomach/Bowel: Previously seen inflammatory change in the descending colon has resolved. Remainder of the colon appears within normal limits. Small bowel and stomach are unremarkable. Vascular/Lymphatic: Aortic atherosclerosis. No enlarged abdominal or pelvic lymph nodes. Reproductive: Uterus and bilateral adnexa are unremarkable. Other: No abdominal wall hernia or abnormality.  No abdominopelvic ascites. Musculoskeletal: No acute or significant osseous findings. IMPRESSION: Resolution of previously seen colitis in the descending colon. No acute abnormality noted. Electronically Signed   By: Alcide Clever M.D.   On: 09/08/2023 03:48    Procedures Procedures    Medications Ordered in ED Medications  oxyCODONE-acetaminophen (PERCOCET/ROXICET) 5-325 MG per tablet 1 tablet (1 tablet Oral Given 09/08/23 0119)  ondansetron (ZOFRAN-ODT) disintegrating tablet 4 mg (4 mg Oral Given 09/08/23 0120)  prochlorperazine (COMPAZINE) injection 10 mg (10 mg Intravenous Given 09/08/23 0251)  fentaNYL (SUBLIMAZE) injection 50 mcg (50 mcg Intravenous Given 09/08/23 0252)  lactated ringers bolus 1,000 mL (0 mLs Intravenous Stopped 09/08/23 0441)  iohexol (OMNIPAQUE) 300 MG/ML solution 100 mL (100 mLs Intravenous Contrast Given 09/08/23 1610)    ED Course/ Medical Decision Making/ A&P  Medical Decision Making Amount and/or Complexity of Data Reviewed Labs: ordered. Radiology: ordered.  Risk Prescription drug management.   Pain resolved with medications here.  CT scan done to evaluate for any locations, colitis versus recurrent colitis versus other possible pathology and this appears normal my interpretation although her gallbladder does look a little bit distended there is no stones or pericholecystic fluid it looks same as it did when she had a normal ultrasound last month.  She probably needs a colonoscopy to look at her colon and then also may be some further workup for gallbladder to see if she has a calculus biliary colic of some sort.  Referred her to GI for further workup, other symptomatic treatment provided at home.  Discharged in stable and pain-free.   Final Clinical Impression(s) / ED Diagnoses Final diagnoses:  Right upper quadrant abdominal pain    Rx / DC Orders ED Discharge Orders          Ordered    promethazine (PHENERGAN) 25 MG  tablet  Every 6 hours PRN        09/08/23 0433    promethazine (PHENERGAN) 25 MG suppository  Every 6 hours PRN        09/08/23 0433    oxyCODONE-acetaminophen (PERCOCET) 5-325 MG tablet  Every 4 hours PRN        09/08/23 0433              Kandace Elrod, Barbara Cower, MD 09/08/23 [id number]

## 2023-09-08 NOTE — ED Notes (Signed)
ED Provider at bedside. 

## 2023-09-08 NOTE — ED Triage Notes (Signed)
Pt POV reporting abd pain that began Thursday and worsened tonight, dx colitis 3 weeks ago, says it feels the same, 5 episodes of emesis today.

## 2023-09-09 ENCOUNTER — Ambulatory Visit (INDEPENDENT_AMBULATORY_CARE_PROVIDER_SITE_OTHER): Payer: BC Managed Care – PPO | Admitting: Family Medicine

## 2023-09-09 ENCOUNTER — Other Ambulatory Visit: Payer: Self-pay | Admitting: Family Medicine

## 2023-09-09 VITALS — BP 131/88 | HR 96 | Temp 97.5°F | Ht 64.0 in | Wt 171.4 lb

## 2023-09-09 DIAGNOSIS — R1011 Right upper quadrant pain: Secondary | ICD-10-CM | POA: Diagnosis not present

## 2023-09-09 DIAGNOSIS — R1013 Epigastric pain: Secondary | ICD-10-CM | POA: Diagnosis not present

## 2023-09-09 MED ORDER — PANTOPRAZOLE SODIUM 40 MG PO TBEC: 40.0000 mg | DELAYED_RELEASE_TABLET | Freq: Two times a day (BID) | ORAL | 1 refills | Status: DC

## 2023-09-09 MED ORDER — PROCHLORPERAZINE MALEATE 10 MG PO TABS: 10.0000 mg | ORAL_TABLET | Freq: Four times a day (QID) | ORAL | 0 refills | Status: DC | PRN

## 2023-09-09 NOTE — Patient Instructions (Addendum)
Compazine as directed.  Stop omeprazole.  Start Protonix twice daily.  I have placed an order for the HIDA scan  Appointment with GI at 850 tomorrow.  Please arrive early. [address] [place], [place] [address]

## 2023-09-09 NOTE — Progress Notes (Signed)
Subjective:  Patient ID: Melanie Clark, female    DOB: 1975/12/25  Age: 47 y.o. MRN: [id number]  CC: Abdominal pain   HPI:  47 year old female presents for evaluation of persistent abdominal pain  Was seen in the ER for abdominal pain on 11/18.  Was found to have colitis.  Treated with Augmentin.  Patient improved and then abdominal pain recurred prompting ER visit yesterday.  CT scan was obtained and revealed no evidence of colitis.  No other acute intra-abdominal findings.  No focal abnormalities of the liver or gallbladder.  Pancreas normal.  Labs revealed hyponatremia.  Normal lipase.  Normal LFTs.  Slightly low WBC count.  Patient was given IV fentanyl and Compazine with improvement.  Patient presents today reporting continued abdominal pain.  She states that she had brief improvement with hospital treatment.  She has had persistent nausea, epigastric and right upper quadrant pain and decreased p.o. intake.  She is very uncomfortable currently.  Patient Active Problem List   Diagnosis Date Noted   RUQ pain 09/09/2023   Epigastric pain 09/09/2023   GERD (gastroesophageal reflux disease) 12/06/2021   Depression, major, single episode, mild (HCC) 11/17/2017   Essential hypertension, benign 01/06/2013    Social Hx   Social History   Socioeconomic History   Marital status: Unknown    Spouse name: Not on file   Number of children: Not on file   Years of education: Not on file   Highest education level: Not on file  Occupational History   Not on file  Tobacco Use   Smoking status: Never   Smokeless tobacco: Never  Substance and Sexual Activity   Alcohol use: Yes    Comment: social   Drug use: No   Sexual activity: Not on file  Other Topics Concern   Not on file  Social History Narrative   Not on file   Social Determinants of Health   Financial Resource Strain: Not on file  Food Insecurity: Not on file  Transportation Needs: Not on file  Physical Activity:  Not on file  Stress: Not on file  Social Connections: Not on file    Review of Systems Per HPI  Objective:  BP 131/88   Pulse 96   Temp (!) 97.5 F (36.4 C)   Ht 5\' 4"  (1.626 m)   Wt 171 lb 6.4 oz (77.7 kg)   SpO2 99%   BMI 29.42 kg/m      09/09/2023   11:06 AM 09/08/2023    3:00 AM 09/08/2023    2:30 AM  BP/Weight  Systolic BP 131 103 104  Diastolic BP 88 71 74  Wt. (Lbs) 171.4    BMI 29.42 kg/m2      Physical Exam Vitals and nursing note reviewed.  Constitutional:      Appearance: She is ill-appearing.  HENT:     Head: Normocephalic and atraumatic.  Cardiovascular:     Rate and Rhythm: Normal rate and regular rhythm.  Pulmonary:     Effort: Pulmonary effort is normal.     Breath sounds: Normal breath sounds. No wheezing or rales.  Abdominal:     General: There is no distension.     Palpations: Abdomen is soft.     Comments: Exquisite tenderness in the epigastric region and right upper quadrant.  Neurological:     Mental Status: She is alert.     Lab Results  Component Value Date   WBC 3.8 (L) 09/08/2023   HGB 14.7 09/08/2023  HCT 41.2 09/08/2023   PLT 217 09/08/2023   GLUCOSE 107 (H) 09/08/2023   CHOL 209 (H) 07/08/2023   TRIG 113 07/08/2023   HDL 59 07/08/2023   LDLCALC 130 (H) 07/08/2023   ALT 30 09/08/2023   AST 36 09/08/2023   NA 128 (L) 09/08/2023   K 4.6 09/08/2023   CL 92 (L) 09/08/2023   CREATININE 0.77 09/08/2023   BUN 17 09/08/2023   CO2 26 09/08/2023   TSH 3.080 10/26/2019     Assessment & Plan:   Problem List Items Addressed This Visit       Other   Epigastric pain   Relevant Orders   NM Hepato W/Eject Fract   Ambulatory referral to Gastroenterology   RUQ pain - Primary    Patient experiencing severe right upper quadrant pain and epigastric pain.  Prior to CT from ER visit in November, ultrasound was obtained and was negative.  Patient's had 2 CTs both of which have not revealed any hepatobiliary findings. Stopping  omeprazole.  Starting Protonix twice daily.  Compazine as needed for nausea.  Advised to be sure she is intaking fluids.  If not, she needs to go directly to the hospital. Arranging for HIDA scan.  Spoke with GI physician Dr. Marletta Lor, he will see her tomorrow morning.  I appreciate his care and ability to see this patient very quickly.      Relevant Orders   NM Hepato W/Eject Fract   Ambulatory referral to Gastroenterology    Meds ordered this encounter  Medications   prochlorperazine (COMPAZINE) 10 MG tablet    Sig: Take 1 tablet (10 mg total) by mouth every 6 (six) hours as needed for nausea or vomiting.    Dispense:  90 tablet    Refill:  0   pantoprazole (PROTONIX) 40 MG tablet    Sig: Take 1 tablet (40 mg total) by mouth 2 (two) times daily before a meal.    Dispense:  60 tablet    Refill:  1    Follow-up:  Return in about 1 week (around 09/16/2023).  Everlene Other DO [facility]

## 2023-09-09 NOTE — Assessment & Plan Note (Addendum)
Patient experiencing severe right upper quadrant pain and epigastric pain.  Prior to CT from ER visit in November, ultrasound was obtained and was negative.  Patient's had 2 CTs both of which have not revealed any hepatobiliary findings. Stopping omeprazole.  Starting Protonix twice daily.  Compazine as needed for nausea.  Advised to be sure she is intaking fluids.  If not, she needs to go directly to the hospital. Arranging for HIDA scan.  Spoke with GI physician Dr. Marletta Lor, he will see her tomorrow morning.  I appreciate his care and ability to see this patient very quickly.

## 2023-09-10 ENCOUNTER — Encounter: Payer: Self-pay | Admitting: Internal Medicine

## 2023-09-10 ENCOUNTER — Other Ambulatory Visit: Payer: Self-pay | Admitting: *Deleted

## 2023-09-10 ENCOUNTER — Encounter: Payer: Self-pay | Admitting: Family Medicine

## 2023-09-10 ENCOUNTER — Ambulatory Visit (INDEPENDENT_AMBULATORY_CARE_PROVIDER_SITE_OTHER): Payer: BC Managed Care – PPO | Admitting: Internal Medicine

## 2023-09-10 VITALS — BP 129/91 | HR 80 | Temp 97.5°F | Ht 64.0 in | Wt 172.0 lb

## 2023-09-10 DIAGNOSIS — K529 Noninfective gastroenteritis and colitis, unspecified: Secondary | ICD-10-CM | POA: Diagnosis not present

## 2023-09-10 DIAGNOSIS — R1013 Epigastric pain: Secondary | ICD-10-CM

## 2023-09-10 DIAGNOSIS — R1011 Right upper quadrant pain: Secondary | ICD-10-CM

## 2023-09-10 DIAGNOSIS — R112 Nausea with vomiting, unspecified: Secondary | ICD-10-CM

## 2023-09-10 NOTE — Patient Instructions (Signed)
I am going to reach out to surgery and discuss your case further with them.  Once I have heard back, we will call you and let you know next steps.  This may include completing your HIDA scan which is scheduled for next week.  This may include seeing general surgery to talk about having your gallbladder removed.    I do think you need upper endoscopy and colonoscopy at some point, timing to be determined after I talk to the surgeon.  It was very nice meeting you today.  Dr. Marletta Lor

## 2023-09-10 NOTE — Progress Notes (Signed)
Primary Care Physician:  Tommie Sams, DO Primary Gastroenterologist:  Dr. Marletta Lor  Chief Complaint  Patient presents with   Abdominal Pain    RUQ pain, nausea and vomiting. Went to ED on 08/18/23 and again on 09/08/23. Taking oxycodone for pain and compazine for nausea.  Has not ate since Sunday. Started on pantoprazole yesterday ordered by pcp.     HPI:   Melanie Clark is a 47 y.o. female who presents to the clinic today for urgent visit by her PCP Dr. Adriana Simas for evaluation.  Patient was in her normal state of health until approximately 6 weeks ago when she had sudden onset abdominal pain epigastric/right upper quadrant, severe, also notes associated nausea and vomiting.  States she started weight loss medication Tirzepatide prior to onset her symptoms.  Presented to [facility], ER 08/19/2023, blood work unremarkable besides WBC 12.1.  CT abdomen pelvis which I personally reviewed findings consistent with left-sided colitis.    Right upper quadrant ultrasound remarkable.  Was discharged on Augmentin.  Patient states her symptoms improved slightly but reoccurred soon after prompting another ER visit 09/08/2023.  CT abdomen pelvis with contrast which I personally reviewed unremarkable.  Blood work with WBC 3.8, sodium 128.  Seen by Dr. Adriana Simas yesterday, started on pantoprazole daily, Compazine as needed for nausea.  HIDA scan scheduled for next week.  Today, states she is miserable.  Tearful on exam.  Abdominal pain is constant moderate to severe epigastric right upper quadrant.  Continues to be nauseous.  States she took a pain pill today which has let her tolerate fluids.  Has not eaten any food in 3 to 4 days.  Past Medical History:  Diagnosis Date   GERD (gastroesophageal reflux disease)    Hypertension     Past Surgical History:  Procedure Laterality Date   CESAREAN SECTION      Current Outpatient Medications  Medication Sig Dispense Refill   atenolol (TENORMIN)  100 MG tablet Take 1 tablet (100 mg total) by mouth daily. 90 tablet 3   buPROPion (WELLBUTRIN SR) 150 MG 12 hr tablet Take 1 tablet (150 mg total) by mouth 2 (two) times daily. 180 tablet 3   dicyclomine (BENTYL) 20 MG tablet Take 1 tablet (20 mg total) by mouth 2 (two) times daily. 20 tablet 0   enalapril (VASOTEC) 20 MG tablet Take 1 tablet (20 mg total) by mouth daily. 90 tablet 3   oxyCODONE-acetaminophen (PERCOCET) 5-325 MG tablet Take 2 tablets by mouth every 4 (four) hours as needed. 10 tablet 0   pantoprazole (PROTONIX) 40 MG tablet Take 1 tablet (40 mg total) by mouth daily. 30 tablet 1   prochlorperazine (COMPAZINE) 10 MG tablet Take 1 tablet (10 mg total) by mouth every 6 (six) hours as needed for nausea or vomiting. 90 tablet 0   No current facility-administered medications for this visit.    Allergies as of 09/10/2023   (No Known Allergies)    Family History  Problem Relation Age of Onset   Hypertension Mother    Hypertension Father    Diabetes Other    Hypertension Other    Cancer Other    Hypertension Other    Hypertension Paternal Grandmother    Hypertension Paternal Grandfather     Social History   Socioeconomic History   Marital status: Unknown    Spouse name: Not on file   Number of children: Not on file   Years of education: Not on file  Highest education level: Not on file  Occupational History   Not on file  Tobacco Use   Smoking status: Never   Smokeless tobacco: Never  Substance and Sexual Activity   Alcohol use: Yes    Comment: social   Drug use: No   Sexual activity: Not on file  Other Topics Concern   Not on file  Social History Narrative   Not on file   Social Determinants of Health   Financial Resource Strain: Not on file  Food Insecurity: Not on file  Transportation Needs: Not on file  Physical Activity: Not on file  Stress: Not on file  Social Connections: Not on file  Intimate Partner Violence: Not on file     Subjective: Review of Systems  Constitutional:  Negative for chills and fever.  HENT:  Negative for congestion and hearing loss.   Eyes:  Negative for blurred vision and double vision.  Respiratory:  Negative for cough and shortness of breath.   Cardiovascular:  Negative for chest pain and palpitations.  Gastrointestinal:  Positive for abdominal pain, nausea and vomiting. Negative for blood in stool, constipation, diarrhea, heartburn and melena.  Genitourinary:  Negative for dysuria and urgency.  Musculoskeletal:  Negative for joint pain and myalgias.  Skin:  Negative for itching and rash.  Neurological:  Negative for dizziness and headaches.  Psychiatric/Behavioral:  Negative for depression. The patient is not nervous/anxious.        Objective: BP (!) 129/91   Pulse 80   Temp (!) 97.5 F (36.4 C) (Oral)   Ht 5\' 4"  (1.626 m)   Wt 172 lb (78 kg)   BMI 29.52 kg/m  Physical Exam Constitutional:      Appearance: Normal appearance.  HENT:     Head: Normocephalic and atraumatic.  Eyes:     Extraocular Movements: Extraocular movements intact.     Conjunctiva/sclera: Conjunctivae normal.  Cardiovascular:     Rate and Rhythm: Normal rate and regular rhythm.  Pulmonary:     Effort: Pulmonary effort is normal.     Breath sounds: Normal breath sounds.  Abdominal:     General: Bowel sounds are normal.     Palpations: Abdomen is soft.     Tenderness: There is abdominal tenderness in the right upper quadrant and epigastric area.  Musculoskeletal:        General: No swelling. Normal range of motion.     Cervical back: Normal range of motion and neck supple.  Skin:    General: Skin is warm and dry.     Coloration: Skin is not jaundiced.  Neurological:     General: No focal deficit present.     Mental Status: She is alert and oriented to person, place, and time.  Psychiatric:        Mood and Affect: Mood normal.        Behavior: Behavior normal.      Assessment: *Abdominal pain-epigastric, RUQ *Nausea and vomiting *Colitis  Plan: Discussed case with Dr. Lovell Sheehan, concern for biliary colic.  HIDA scan moved to tomorrow.  Office visit with Dr. Lovell Sheehan next week.  Appreciate his help immensely.  Continue on Compazine, pantoprazole twice daily.  Likely needs EGD and colonoscopy to further evaluate though we will hold off on scheduling for now.  Follow-up with me in 6 weeks  09/10/2023 8:47 AM   Disclaimer: This note was dictated with voice recognition software. Similar sounding words can inadvertently be transcribed and may not be corrected upon review.

## 2023-09-11 ENCOUNTER — Encounter (HOSPITAL_COMMUNITY): Admission: RE | Admit: 2023-09-11 | Payer: BC Managed Care – PPO | Source: Ambulatory Visit

## 2023-09-11 ENCOUNTER — Other Ambulatory Visit: Payer: Self-pay | Admitting: Family Medicine

## 2023-09-11 MED ORDER — OXYCODONE-ACETAMINOPHEN 5-325 MG PO TABS: 1.0000 | ORAL_TABLET | Freq: Four times a day (QID) | ORAL | 0 refills | Status: DC | PRN

## 2023-09-12 ENCOUNTER — Observation Stay (HOSPITAL_COMMUNITY)
Admission: EM | Admit: 2023-09-12 | Discharge: 2023-09-13 | Disposition: A | Discharge status: Home / Self Care | Payer: BC Managed Care – PPO | Attending: General Surgery | Admitting: General Surgery

## 2023-09-12 ENCOUNTER — Encounter (HOSPITAL_COMMUNITY): Payer: Self-pay

## 2023-09-12 ENCOUNTER — Other Ambulatory Visit: Payer: Self-pay

## 2023-09-12 ENCOUNTER — Encounter (HOSPITAL_COMMUNITY)
Admission: RE | Admit: 2023-09-12 | Discharge: 2023-09-12 | Disposition: A | Discharge status: Home / Self Care | Payer: BC Managed Care – PPO | Source: Ambulatory Visit | Attending: Family Medicine | Admitting: Family Medicine

## 2023-09-12 DIAGNOSIS — I10 Essential (primary) hypertension: Secondary | ICD-10-CM | POA: Diagnosis present

## 2023-09-12 DIAGNOSIS — K828 Other specified diseases of gallbladder: Secondary | ICD-10-CM | POA: Diagnosis not present

## 2023-09-12 DIAGNOSIS — K811 Chronic cholecystitis: Secondary | ICD-10-CM | POA: Diagnosis not present

## 2023-09-12 DIAGNOSIS — Z79899 Other long term (current) drug therapy: Secondary | ICD-10-CM | POA: Diagnosis not present

## 2023-09-12 DIAGNOSIS — R1013 Epigastric pain: Secondary | ICD-10-CM | POA: Insufficient documentation

## 2023-09-12 DIAGNOSIS — R109 Unspecified abdominal pain: Secondary | ICD-10-CM | POA: Diagnosis present

## 2023-09-12 DIAGNOSIS — R1011 Right upper quadrant pain: Secondary | ICD-10-CM | POA: Insufficient documentation

## 2023-09-12 DIAGNOSIS — R948 Abnormal results of function studies of other organs and systems: Principal | ICD-10-CM

## 2023-09-12 LAB — COMPREHENSIVE METABOLIC PANEL
ALT: 43 U/L (ref 0–44)
AST: 30 U/L (ref 15–41)
Albumin: 4.4 g/dL (ref 3.5–5.0)
Alkaline Phosphatase: 54 U/L (ref 38–126)
Anion gap: 12 (ref 5–15)
BUN: 13 mg/dL (ref 6–20)
CO2: 24 mmol/L (ref 22–32)
Calcium: 8.9 mg/dL (ref 8.9–10.3)
Chloride: 89 mmol/L — ABNORMAL LOW (ref 98–111)
Creatinine, Ser: 0.94 mg/dL (ref 0.44–1.00)
GFR, Estimated: >60 mL/min (ref >60)
Glucose, Bld: 123 mg/dL — ABNORMAL HIGH (ref 70–99)
Potassium: 3.7 mmol/L (ref 3.5–5.1)
Sodium: 125 mmol/L — ABNORMAL LOW (ref 135–145)
Total Bilirubin: 0.7 mg/dL (ref <1.2)
Total Protein: 7.5 g/dL (ref 6.5–8.1)

## 2023-09-12 LAB — CBC WITH DIFFERENTIAL/PLATELET
Abs Immature Granulocytes: 0.11 10*3/uL — ABNORMAL HIGH (ref 0.00–0.07)
Basophils Absolute: 0 10*3/uL (ref 0.0–0.1)
Basophils Relative: 0 %
Eosinophils Absolute: 0.2 10*3/uL (ref 0.0–0.5)
Eosinophils Relative: 2 %
HCT: 41 % (ref 36.0–46.0)
Hemoglobin: 14.3 g/dL (ref 12.0–15.0)
Immature Granulocytes: 1 %
Lymphocytes Relative: 17 %
Lymphs Abs: 1.8 10*3/uL (ref 0.7–4.0)
MCH: 29.9 pg (ref 26.0–34.0)
MCHC: 34.9 g/dL (ref 30.0–36.0)
MCV: 85.6 fL (ref 80.0–100.0)
Monocytes Absolute: 1 10*3/uL (ref 0.1–1.0)
Monocytes Relative: 9 %
Neutro Abs: 7.7 10*3/uL (ref 1.7–7.7)
Neutrophils Relative %: 71 %
Platelets: 468 10*3/uL — ABNORMAL HIGH (ref 150–400)
RBC: 4.79 MIL/uL (ref 3.87–5.11)
RDW: 11.4 % — ABNORMAL LOW (ref 11.5–15.5)
WBC: 10.9 10*3/uL — ABNORMAL HIGH (ref 4.0–10.5)
nRBC: 0 % (ref 0.0–0.2)

## 2023-09-12 LAB — LIPASE, BLOOD: Lipase: 70 U/L — ABNORMAL HIGH (ref 11–51)

## 2023-09-12 MED ORDER — SINCALIDE 5 MCG IJ SOLR
INTRAMUSCULAR | Status: AC
  Filled 2023-09-12: qty 5

## 2023-09-12 MED ORDER — STERILE WATER FOR INJECTION IJ SOLN
INTRAMUSCULAR | Status: AC
  Filled 2023-09-12: qty 10

## 2023-09-12 MED ORDER — TECHNETIUM TC 99M MEBROFENIN IV KIT
5.0000 | PACK | Freq: Once | INTRAVENOUS | Status: AC | PRN
  Administered 2023-09-12: 5 via INTRAVENOUS

## 2023-09-12 NOTE — ED Triage Notes (Signed)
C/o RUQ abd pain that started after eating dinner with n/v/d.  Pt reports 2 oxycodone and compazine 1 hr PTA w/o relief.

## 2023-09-12 NOTE — ED Provider Triage Note (Signed)
Emergency Medicine Provider Triage Evaluation Note  Melanie Clark , a 47 y.o. female  was evaluated in triage.  Pt complains of RUQ abdominal pain that began after eating dinner.  Endorses nausea, vomiting, and diarrhea.  She took 2 oxycodone and compazine 1 hour prior to ED arrival without relief of symptoms.  She reports she had a HIDA scan today that showed gallbladder EF of <5%.   Review of Systems  Positive: As above Negative: As above  Physical Exam  BP (!) 159/102 (BP Location: Right Arm)   Pulse 76   Temp 97.9 F (36.6 C) (Oral)   Resp 18   SpO2 100%  Gen:   Awake, no distress   Resp:  Normal effort  MSK:   Moves extremities without difficulty  Other:    Medical Decision Making  Medically screening exam initiated at 8:03 PM.  Appropriate orders placed.  [name] was informed that the remainder of the evaluation will be completed by another provider, this initial triage assessment does not replace that evaluation, and the importance of remaining in the ED until their evaluation is complete.     Lenard Simmer, PA-C 09/12/23 2006

## 2023-09-13 ENCOUNTER — Observation Stay (HOSPITAL_COMMUNITY): Payer: BC Managed Care – PPO | Admitting: Certified Registered Nurse Anesthetist

## 2023-09-13 ENCOUNTER — Emergency Department (HOSPITAL_COMMUNITY): Payer: BC Managed Care – PPO

## 2023-09-13 ENCOUNTER — Encounter (HOSPITAL_COMMUNITY)
Admission: EM | Disposition: A | Discharge status: Home / Self Care | Payer: Self-pay | Source: Home / Self Care | Attending: Emergency Medicine

## 2023-09-13 ENCOUNTER — Other Ambulatory Visit: Payer: Self-pay

## 2023-09-13 DIAGNOSIS — R948 Abnormal results of function studies of other organs and systems: Secondary | ICD-10-CM | POA: Diagnosis not present

## 2023-09-13 DIAGNOSIS — R1011 Right upper quadrant pain: Secondary | ICD-10-CM | POA: Diagnosis not present

## 2023-09-13 DIAGNOSIS — I10 Essential (primary) hypertension: Secondary | ICD-10-CM | POA: Diagnosis not present

## 2023-09-13 DIAGNOSIS — R109 Unspecified abdominal pain: Secondary | ICD-10-CM | POA: Diagnosis present

## 2023-09-13 HISTORY — PX: CHOLECYSTECTOMY: SHX55

## 2023-09-13 LAB — COMPREHENSIVE METABOLIC PANEL
ALT: 39 U/L (ref 0–44)
AST: 28 U/L (ref 15–41)
Albumin: 4.4 g/dL (ref 3.5–5.0)
Alkaline Phosphatase: 55 U/L (ref 38–126)
Anion gap: 11 (ref 5–15)
BUN: 14 mg/dL (ref 6–20)
CO2: 25 mmol/L (ref 22–32)
Calcium: 9 mg/dL (ref 8.9–10.3)
Chloride: 92 mmol/L — ABNORMAL LOW (ref 98–111)
Creatinine, Ser: 0.66 mg/dL (ref 0.44–1.00)
GFR, Estimated: >60 mL/min (ref >60)
Glucose, Bld: 109 mg/dL — ABNORMAL HIGH (ref 70–99)
Potassium: 3.9 mmol/L (ref 3.5–5.1)
Sodium: 128 mmol/L — ABNORMAL LOW (ref 135–145)
Total Bilirubin: 0.7 mg/dL (ref <1.2)
Total Protein: 7.3 g/dL (ref 6.5–8.1)

## 2023-09-13 LAB — CBC WITH DIFFERENTIAL/PLATELET
Abs Immature Granulocytes: 0.13 10*3/uL — ABNORMAL HIGH (ref 0.00–0.07)
Basophils Absolute: 0 10*3/uL (ref 0.0–0.1)
Basophils Relative: 0 %
Eosinophils Absolute: 0.1 10*3/uL (ref 0.0–0.5)
Eosinophils Relative: 1 %
HCT: 40.1 % (ref 36.0–46.0)
Hemoglobin: 13.9 g/dL (ref 12.0–15.0)
Immature Granulocytes: 1 %
Lymphocytes Relative: 16 %
Lymphs Abs: 2.1 10*3/uL (ref 0.7–4.0)
MCH: 29.8 pg (ref 26.0–34.0)
MCHC: 34.7 g/dL (ref 30.0–36.0)
MCV: 86.1 fL (ref 80.0–100.0)
Monocytes Absolute: 1.2 10*3/uL — ABNORMAL HIGH (ref 0.1–1.0)
Monocytes Relative: 9 %
Neutro Abs: 9.7 10*3/uL — ABNORMAL HIGH (ref 1.7–7.7)
Neutrophils Relative %: 73 %
Platelets: 520 10*3/uL — ABNORMAL HIGH (ref 150–400)
RBC: 4.66 MIL/uL (ref 3.87–5.11)
RDW: 11.6 % (ref 11.5–15.5)
WBC: 13.2 10*3/uL — ABNORMAL HIGH (ref 4.0–10.5)
nRBC: 0 % (ref 0.0–0.2)

## 2023-09-13 LAB — MAGNESIUM: Magnesium: 2.2 mg/dL (ref 1.7–2.4)

## 2023-09-13 SURGERY — LAPAROSCOPIC CHOLECYSTECTOMY WITH INTRAOPERATIVE CHOLANGIOGRAM
Anesthesia: General

## 2023-09-13 MED ORDER — ROCURONIUM BROMIDE 10 MG/ML (PF) SYRINGE
PREFILLED_SYRINGE | INTRAVENOUS | Status: AC
  Filled 2023-09-13: qty 10

## 2023-09-13 MED ORDER — ACETAMINOPHEN 325 MG PO TABS: 650.0000 mg | ORAL_TABLET | Freq: Four times a day (QID) | ORAL | Status: DC | PRN

## 2023-09-13 MED ORDER — BUPIVACAINE-EPINEPHRINE 0.25% -1:200000 IJ SOLN
INTRAMUSCULAR | Status: DC | PRN
  Administered 2023-09-13: 15 mL

## 2023-09-13 MED ORDER — MIDAZOLAM HCL 5 MG/5ML IJ SOLN
INTRAMUSCULAR | Status: DC | PRN
  Administered 2023-09-13: 2 mg via INTRAVENOUS

## 2023-09-13 MED ORDER — ENOXAPARIN SODIUM 40 MG/0.4ML IJ SOSY
40.0000 mg | PREFILLED_SYRINGE | INTRAMUSCULAR | Status: DC
Start: 2023-09-13 — End: 2023-09-13

## 2023-09-13 MED ORDER — ACETAMINOPHEN 500 MG PO TABS
ORAL_TABLET | ORAL | Status: AC
  Filled 2023-09-13: qty 2

## 2023-09-13 MED ORDER — SUCCINYLCHOLINE CHLORIDE 200 MG/10ML IV SOSY
PREFILLED_SYRINGE | INTRAVENOUS | Status: DC | PRN
  Administered 2023-09-13: 80 mg via INTRAVENOUS

## 2023-09-13 MED ORDER — ONDANSETRON HCL 4 MG/2ML IJ SOLN
INTRAMUSCULAR | Status: AC
  Filled 2023-09-13: qty 2

## 2023-09-13 MED ORDER — KETOROLAC TROMETHAMINE 30 MG/ML IJ SOLN
INTRAMUSCULAR | Status: AC
  Filled 2023-09-13: qty 1

## 2023-09-13 MED ORDER — OXYCODONE HCL 5 MG/5ML PO SOLN: 5.0000 mg | Freq: Once | ORAL | Status: DC | PRN

## 2023-09-13 MED ORDER — BUPIVACAINE-EPINEPHRINE 0.25% -1:200000 IJ SOLN
INTRAMUSCULAR | Status: AC
  Filled 2023-09-13: qty 1

## 2023-09-13 MED ORDER — SODIUM CHLORIDE 0.9 % IV SOLN
2.0000 g | Freq: Once | INTRAVENOUS | Status: AC
  Administered 2023-09-13: 2 g via INTRAVENOUS

## 2023-09-13 MED ORDER — FENTANYL CITRATE (PF) 100 MCG/2ML IJ SOLN
INTRAMUSCULAR | Status: AC
  Filled 2023-09-13: qty 2

## 2023-09-13 MED ORDER — SUGAMMADEX SODIUM 200 MG/2ML IV SOLN
INTRAVENOUS | Status: DC | PRN
  Administered 2023-09-13: 200 mg via INTRAVENOUS

## 2023-09-13 MED ORDER — LACTATED RINGERS IR SOLN
Status: DC | PRN
  Administered 2023-09-13: 1000 mL

## 2023-09-13 MED ORDER — ONDANSETRON HCL 4 MG/2ML IJ SOLN
INTRAMUSCULAR | Status: DC | PRN
  Administered 2023-09-13: 4 mg via INTRAVENOUS

## 2023-09-13 MED ORDER — LIDOCAINE HCL (PF) 2 % IJ SOLN
INTRAMUSCULAR | Status: AC
  Filled 2023-09-13: qty 5

## 2023-09-13 MED ORDER — OXYCODONE HCL 5 MG PO TABS: 5.0000 mg | ORAL_TABLET | ORAL | Status: DC | PRN

## 2023-09-13 MED ORDER — KETOROLAC TROMETHAMINE 30 MG/ML IJ SOLN
INTRAMUSCULAR | Status: DC | PRN
  Administered 2023-09-13: 30 mg via INTRAVENOUS

## 2023-09-13 MED ORDER — ROCURONIUM BROMIDE 10 MG/ML (PF) SYRINGE
PREFILLED_SYRINGE | INTRAVENOUS | Status: DC | PRN
  Administered 2023-09-13: 60 mg via INTRAVENOUS

## 2023-09-13 MED ORDER — ACETAMINOPHEN 500 MG PO TABS
1000.0000 mg | ORAL_TABLET | ORAL | Status: AC
  Administered 2023-09-13: 1000 mg via ORAL

## 2023-09-13 MED ORDER — OXYCODONE HCL 5 MG PO TABS: 5.0000 mg | ORAL_TABLET | Freq: Once | ORAL | Status: DC | PRN

## 2023-09-13 MED ORDER — FENTANYL CITRATE (PF) 100 MCG/2ML IJ SOLN
INTRAMUSCULAR | Status: DC | PRN
  Administered 2023-09-13: 100 ug via INTRAVENOUS
  Administered 2023-09-13 (×2): 50 ug via INTRAVENOUS

## 2023-09-13 MED ORDER — PROCHLORPERAZINE EDISYLATE 10 MG/2ML IJ SOLN
10.0000 mg | Freq: Once | INTRAMUSCULAR | Status: AC
  Administered 2023-09-13: 10 mg via INTRAVENOUS
  Filled 2023-09-13: qty 2

## 2023-09-13 MED ORDER — ONDANSETRON HCL 4 MG PO TABS: 4.0000 mg | ORAL_TABLET | Freq: Four times a day (QID) | ORAL | Status: DC | PRN

## 2023-09-13 MED ORDER — LIDOCAINE 2% (20 MG/ML) 5 ML SYRINGE
INTRAMUSCULAR | Status: DC | PRN
  Administered 2023-09-13: 60 mg via INTRAVENOUS

## 2023-09-13 MED ORDER — 0.9 % SODIUM CHLORIDE (POUR BTL) OPTIME
TOPICAL | Status: DC | PRN
  Administered 2023-09-13: 1000 mL

## 2023-09-13 MED ORDER — PHENYLEPHRINE 80 MCG/ML (10ML) SYRINGE FOR IV PUSH (FOR BLOOD PRESSURE SUPPORT)
PREFILLED_SYRINGE | INTRAVENOUS | Status: AC
  Filled 2023-09-13: qty 10

## 2023-09-13 MED ORDER — SCOPOLAMINE 1 MG/3DAYS TD PT72
MEDICATED_PATCH | TRANSDERMAL | Status: AC
  Filled 2023-09-13: qty 1

## 2023-09-13 MED ORDER — POTASSIUM CHLORIDE IN NACL 20-0.9 MEQ/L-% IV SOLN
INTRAVENOUS | Status: DC
  Filled 2023-09-13: qty 1000

## 2023-09-13 MED ORDER — DEXAMETHASONE SODIUM PHOSPHATE 10 MG/ML IJ SOLN
INTRAMUSCULAR | Status: AC
  Filled 2023-09-13: qty 1

## 2023-09-13 MED ORDER — FENTANYL CITRATE PF 50 MCG/ML IJ SOSY: 25.0000 ug | PREFILLED_SYRINGE | INTRAMUSCULAR | Status: DC | PRN

## 2023-09-13 MED ORDER — SCOPOLAMINE 1 MG/3DAYS TD PT72
MEDICATED_PATCH | TRANSDERMAL | Status: DC | PRN
  Administered 2023-09-13 (×2): 1 via TRANSDERMAL

## 2023-09-13 MED ORDER — ACETAMINOPHEN 650 MG RE SUPP: 650.0000 mg | Freq: Four times a day (QID) | RECTAL | Status: DC | PRN

## 2023-09-13 MED ORDER — OXYCODONE HCL 5 MG PO TABS: 5.0000 mg | ORAL_TABLET | Freq: Four times a day (QID) | ORAL | 0 refills | Status: DC | PRN

## 2023-09-13 MED ORDER — HYDROMORPHONE HCL 1 MG/ML IJ SOLN: 0.5000 mg | INTRAMUSCULAR | Status: DC | PRN

## 2023-09-13 MED ORDER — FENTANYL CITRATE PF 50 MCG/ML IJ SOSY
PREFILLED_SYRINGE | INTRAMUSCULAR | Status: AC
  Filled 2023-09-13: qty 3

## 2023-09-13 MED ORDER — ONDANSETRON HCL 4 MG/2ML IJ SOLN: 4.0000 mg | Freq: Once | INTRAMUSCULAR | Status: DC

## 2023-09-13 MED ORDER — MORPHINE SULFATE (PF) 4 MG/ML IV SOLN
4.0000 mg | Freq: Once | INTRAVENOUS | Status: AC
  Administered 2023-09-13: 4 mg via INTRAVENOUS
  Filled 2023-09-13: qty 1

## 2023-09-13 MED ORDER — PROPOFOL 10 MG/ML IV BOLUS
INTRAVENOUS | Status: DC | PRN
  Administered 2023-09-13: 150 mg via INTRAVENOUS

## 2023-09-13 MED ORDER — SODIUM CHLORIDE 0.9 % IV SOLN
INTRAVENOUS | Status: AC
  Filled 2023-09-13: qty 20

## 2023-09-13 MED ORDER — DEXAMETHASONE SODIUM PHOSPHATE 4 MG/ML IJ SOLN
INTRAMUSCULAR | Status: DC | PRN
  Administered 2023-09-13: 5 mg via INTRAVENOUS

## 2023-09-13 MED ORDER — KETOROLAC TROMETHAMINE 30 MG/ML IJ SOLN: 30.0000 mg | Freq: Once | INTRAMUSCULAR | Status: DC | PRN

## 2023-09-13 MED ORDER — ONDANSETRON HCL 4 MG/2ML IJ SOLN
4.0000 mg | Freq: Four times a day (QID) | INTRAMUSCULAR | Status: DC | PRN
Start: 2023-09-13 — End: 2023-09-13

## 2023-09-13 MED ORDER — PHENYLEPHRINE 80 MCG/ML (10ML) SYRINGE FOR IV PUSH (FOR BLOOD PRESSURE SUPPORT)
PREFILLED_SYRINGE | INTRAVENOUS | Status: DC | PRN
  Administered 2023-09-13: 160 ug via INTRAVENOUS

## 2023-09-13 MED ORDER — LACTATED RINGERS IV SOLN: INTRAVENOUS | Status: DC | PRN

## 2023-09-13 MED ORDER — MIDAZOLAM HCL 2 MG/2ML IJ SOLN
INTRAMUSCULAR | Status: AC
  Filled 2023-09-13: qty 2

## 2023-09-13 MED ORDER — SPY AGENT GREEN - (INDOCYANINE FOR INJECTION)
1.2500 mg | Freq: Once | INTRAMUSCULAR | Status: AC
  Administered 2023-09-13: 1.25 mg via INTRAVENOUS
  Filled 2023-09-13: qty 10

## 2023-09-13 MED ORDER — AMISULPRIDE (ANTIEMETIC) 5 MG/2ML IV SOLN: 10.0000 mg | Freq: Once | INTRAVENOUS | Status: DC | PRN

## 2023-09-13 SURGICAL SUPPLY — 40 items
APPLIER CLIP 5 13 M/L LIGAMAX5 (MISCELLANEOUS) ×1 IMPLANT
APPLIER CLIP ROT 10 11.4 M/L (STAPLE) IMPLANT
BAG COUNTER SPONGE SURGICOUNT (BAG) IMPLANT
BENZOIN TINCTURE PRP APPL 2/3 (GAUZE/BANDAGES/DRESSINGS) IMPLANT
CABLE HIGH FREQUENCY MONO STRZ (ELECTRODE) ×2 IMPLANT
CHLORAPREP W/TINT 26 (MISCELLANEOUS) ×2 IMPLANT
CLIP APPLIE 5 13 M/L LIGAMAX5 (MISCELLANEOUS) ×2 IMPLANT
CLIP APPLIE ROT 10 11.4 M/L (STAPLE) IMPLANT
COVER MAYO STAND XLG (MISCELLANEOUS) ×2 IMPLANT
COVER SURGICAL LIGHT HANDLE (MISCELLANEOUS) ×1 IMPLANT
DERMABOND ADVANCED .7 DNX12 (GAUZE/BANDAGES/DRESSINGS) IMPLANT
DRAPE C-ARM 42X120 X-RAY (DRAPES) IMPLANT
ELECT REM PT RETURN 15FT ADLT (MISCELLANEOUS) ×1 IMPLANT
GLOVE BIO SURGEON STRL SZ7 (GLOVE) ×1 IMPLANT
GLOVE BIOGEL PI IND STRL 7.5 (GLOVE) ×2 IMPLANT
GOWN STRL REUS W/ TWL LRG LVL3 (GOWN DISPOSABLE) ×2 IMPLANT
GRASPER SUT TROCAR 14GX15 (MISCELLANEOUS) IMPLANT
HEMOSTAT SNOW SURGICEL 2X4 (HEMOSTASIS) IMPLANT
IRRIG SUCT STRYKERFLOW 2 WTIP (MISCELLANEOUS) ×1 IMPLANT
IRRIGATION SUCT STRKRFLW 2 WTP (MISCELLANEOUS) ×2 IMPLANT
KIT BASIN OR (CUSTOM PROCEDURE TRAY) ×1 IMPLANT
KIT TURNOVER KIT A (KITS) IMPLANT
PENCIL SMOKE EVACUATOR (MISCELLANEOUS) IMPLANT
POUCH RETRIEVAL ECOSAC 10 (ENDOMECHANICALS) ×1 IMPLANT
PROTECTOR NERVE ULNAR (MISCELLANEOUS) IMPLANT
SCISSORS LAP 5X35 DISP (ENDOMECHANICALS) ×1 IMPLANT
SET CHOLANGIOGRAPH MIX (MISCELLANEOUS) IMPLANT
SET TUBE SMOKE EVAC HIGH FLOW (TUBING) ×1 IMPLANT
SLEEVE Z-THREAD 5X100MM (TROCAR) ×4 IMPLANT
SPIKE FLUID TRANSFER (MISCELLANEOUS) ×1 IMPLANT
STRIP CLOSURE SKIN 1/2X4 (GAUZE/BANDAGES/DRESSINGS) IMPLANT
SUT MNCRL AB 4-0 PS2 18 (SUTURE) ×1 IMPLANT
SUT VICRYL 0 TIES 12 18 (SUTURE) IMPLANT
SUT VICRYL 0 UR6 27IN ABS (SUTURE) IMPLANT
TOWEL OR 17X26 10 PK STRL BLUE (TOWEL DISPOSABLE) ×2 IMPLANT
TOWEL OR NON WOVEN STRL DISP B (DISPOSABLE) ×2 IMPLANT
TRAY LAPAROSCOPIC (CUSTOM PROCEDURE TRAY) ×1 IMPLANT
TROCAR 11X100 Z THREAD (TROCAR) IMPLANT
TROCAR BALLN 12MMX100 BLUNT (TROCAR) ×2 IMPLANT
TROCAR Z-THREAD OPTICAL 5X100M (TROCAR) ×1 IMPLANT

## 2023-09-13 NOTE — Transfer of Care (Signed)
Immediate Anesthesia Transfer of Care Note  Patient: Melanie Clark  Procedure(s) Performed: LAPAROSCOPIC CHOLECYSTECTOMY WITH ICG  Patient Location: PACU  Anesthesia Type:General  Level of Consciousness: awake and patient cooperative  Airway & Oxygen Therapy: Patient Spontanous Breathing and Patient connected to nasal cannula oxygen  Post-op Assessment: Report given to RN and Post -op Vital signs reviewed and stable  Post vital signs: Reviewed and stable  Last Vitals:  Vitals Value Taken Time  BP 158/91 09/13/23 0916  Temp    Pulse 69 09/13/23 0918  Resp 12 09/13/23 0918  SpO2 100 % 09/13/23 0918  Vitals shown include unfiled device data.  Last Pain:  Vitals:   09/13/23 0745  TempSrc:   PainSc: 3          Complications: No notable events documented.

## 2023-09-13 NOTE — H&P (Signed)
PCP:   Tommie Sams, DO   Chief Complaint:  Abdominal pain  HPI: This is a 47 year old female with a past medical history significant for hypertension and GERD.  Patient presents with complaint of abdominal pain.  This has been ongoing since at least 11/19.  At that time she presented to the ER with abdominal pain and bloody stools.  CT abdomen was positive for colitis.  She was treated with antibiotics discharged home.  Per patient blood yesterday has resolved but the abdominal pain has persisted.  The pain has been severe.  It started approximately 10 minutes after each meal.  It is in the right upper quadrant.  She has nausea, vomiting and feels as though she is about to die.  She is eating very little bit in the last 3 weeks and even less over the last week.  She follow-uped with GI yesterday where HIDA scan was ordered.  GI thought this was more likely surgical/gallbladder.  HIDA scan resulted with a EF less than 5.  She represents to the ER.  Surgeon contacted by EDP.  Per report it is thought it is more GI related.  GI consult recommended.  Hospitalist asked to admit..  Review of Systems:  Per HPI  Past Medical History: Past Medical History:  Diagnosis Date   GERD (gastroesophageal reflux disease)    Hypertension    Past Surgical History:  Procedure Laterality Date   CESAREAN SECTION      Medications: Prior to Admission medications   Medication Sig Start Date End Date Taking? Authorizing Provider  atenolol (TENORMIN) 100 MG tablet Take 1 tablet (100 mg total) by mouth daily. 05/05/23  Yes Cook, Jayce G, DO  buPROPion (WELLBUTRIN SR) 150 MG 12 hr tablet Take 1 tablet (150 mg total) by mouth 2 (two) times daily. 05/05/23  Yes Cook, Jayce G, DO  dicyclomine (BENTYL) 20 MG tablet Take 1 tablet (20 mg total) by mouth 2 (two) times daily. Patient taking differently: Take 20 mg by mouth 2 (two) times daily as needed for spasms. 08/19/23  Yes Sabas Sous, MD  enalapril (VASOTEC) 20 MG  tablet Take 1 tablet (20 mg total) by mouth daily. 05/05/23  Yes Cook, Jayce G, DO  oxyCODONE-acetaminophen (PERCOCET) 5-325 MG tablet Take 1-2 tablets by mouth every 6 (six) hours as needed for severe pain (pain score 7-10). 09/11/23  Yes Cook, Jayce G, DO  pantoprazole (PROTONIX) 40 MG tablet Take 1 tablet (40 mg total) by mouth daily. 09/09/23  Yes Cook, Jayce G, DO  prochlorperazine (COMPAZINE) 10 MG tablet Take 1 tablet (10 mg total) by mouth every 6 (six) hours as needed for nausea or vomiting. 09/09/23  Yes Cook, Jayce G, DO  ZEPBOUND 5 MG/0.5ML Pen Inject 5 mg into the skin once a week. Patient not taking: Reported on 09/13/2023 08/22/23   [provider]    Allergies:  No Known Allergies  Social History:  reports that she has never smoked. She has never used smokeless tobacco. She reports current alcohol use. She reports that she does not use drugs.  Family History: Family History  Problem Relation Age of Onset   Hypertension Mother    Hypertension Father    Diabetes Other    Hypertension Other    Cancer Other    Hypertension Other    Hypertension Paternal Grandmother    Hypertension Paternal Grandfather     Physical Exam: Vitals:   09/12/23 1959 09/12/23 2004 09/12/23 2351 09/13/23 0135  BP: Marland Kitchen)  159/102  (!) 145/96 (!) 150/96  Pulse: 76  70 81  Resp: 18  16 14   Temp: 97.9 F (36.6 C)  (!) 97.5 F (36.4 C)   TempSrc: Oral  Oral   SpO2: 100%  97% 98%  Weight:  78 kg      General: A and O x 3, well developed and nourished, no acute distress Eyes: Pink conjunctiva, no scleral icterus ENT: Moist oral mucosa, neck supple, no thyromegaly Lungs: CTA B/L, no wheeze, no crackles, no use of accessory muscles Cardiovascular: regular rate and rhythm, no regurgitation, no gallops, no murmurs. No carotid bruits, no JVD Abdomen: soft, positive BS, nonspecific tenderness to palpation, not an acute abdomen GU: not examined Neuro: CN II - XII grossly intact, sensation  intact Musculoskeletal: strength 5/5 all extremities, no edema Skin: no rash, no subcutaneous crepitation, no decubitus Psych: appropriate patient   Labs on Admission:  Recent Labs    09/12/23 2019  NA 125*  K 3.7  CL 89*  CO2 24  GLUCOSE 123*  BUN 13  CREATININE 0.94  CALCIUM 8.9   Recent Labs    09/12/23 2019  AST 30  ALT 43  ALKPHOS 54  BILITOT 0.7  PROT 7.5  ALBUMIN 4.4   Recent Labs    09/12/23 2019  LIPASE 70*   Recent Labs    09/12/23 2019  WBC 10.9*  NEUTROABS 7.7  HGB 14.3  HCT 41.0  MCV 85.6  PLT 468*    Radiological Exams on Admission: NM Hepato W/Eject Fract Result Date: 09/12/2023 CLINICAL DATA:  RIGHT upper quadrant pain. EXAM: NUCLEAR MEDICINE HEPATOBILIARY IMAGING WITH GALLBLADDER EF TECHNIQUE: Sequential images of the abdomen were obtained out to 60 minutes following intravenous administration of radiopharmaceutical. After slow intravenous infusion of 1.6 micrograms Cholecystokinin, gallbladder ejection fraction was determined. RADIOPHARMACEUTICALS:  5.0 mCi Tc-64m Choletec IV COMPARISON:  CT 09/08/2023 FINDINGS: Prompt uptake and biliary excretion of activity by the liver is seen. Gallbladder activity is visualized, consistent with patency of cystic duct. Biliary activity passes into small bowel, consistent with patent common bile duct. Administration of CCK resulted in minimal gallbladder contraction. Ejection fraction less than 5%. Some movement of patient distorts the graphic interpretation. Visually minimal contraction. Calculated gallbladder ejection fraction is less than%. (At 60 min, normal ejection fraction is greater than 40% and less than 80%.) IMPRESSION: 1. Patent cystic duct and common bile duct. 2. Minimal gallbladder contraction with fatty meal stimulation. Estimated less than 5 % EF. Electronically Signed   By: Genevive Bi M.D.   On: 09/12/2023 12:06    Assessment/Plan Present on Admission:  Abdominal pain -HIDA scan EF less  than 5. -N.p.o., IV fluid hydration -Pain medications and antiemetics as needed. -Both GI Dr. Geanie Logan and surgery Dr. Dwain Sarna on-call has been contacted by EDP.  Both services will consult formally   Essential hypertension, benign -Stable Home meds resumed  GERD: -Home meds resumed  [place]ico Cawood 09/13/2023, 4:35 AM

## 2023-09-13 NOTE — Anesthesia Preprocedure Evaluation (Addendum)
Anesthesia Evaluation  Patient identified by MRN, date of birth, ID band Patient awake    Reviewed: Allergy & Precautions, NPO status , Patient's Chart, lab work & pertinent test results  Airway Mallampati: II  TM Distance: >3 FB Neck ROM: Full    Dental no notable dental hx.    Pulmonary neg pulmonary ROS   Pulmonary exam normal        Cardiovascular hypertension, Pt. on home beta blockers and Pt. on medications Normal cardiovascular exam     Neuro/Psych  PSYCHIATRIC DISORDERS  Depression    negative neurological ROS     GI/Hepatic Neg liver ROS,GERD  Medicated and Controlled,,  Endo/Other  negative endocrine ROS    Renal/GU negative Renal ROS     Musculoskeletal negative musculoskeletal ROS (+)    Abdominal   Peds  Hematology negative hematology ROS (+)   Anesthesia Other Findings cholelithiasis  Reproductive/Obstetrics Hcg negative                             Anesthesia Physical Anesthesia Plan  ASA: 2  Anesthesia Plan: General   Post-op Pain Management:    Induction: Intravenous  PONV Risk Score and Plan: 4 or greater and Ondansetron, Dexamethasone, Midazolam, Scopolamine patch - Pre-op and Treatment may vary due to age or medical condition  Airway Management Planned: Oral ETT  Additional Equipment:   Intra-op Plan:   Post-operative Plan: Extubation in OR  Informed Consent: I have reviewed the patients History and Physical, chart, labs and discussed the procedure including the risks, benefits and alternatives for the proposed anesthesia with the patient or authorized representative who has indicated his/her understanding and acceptance.     Dental advisory given  Plan Discussed with: CRNA  Anesthesia Plan Comments:        Anesthesia Quick Evaluation

## 2023-09-13 NOTE — Consult Note (Signed)
Reason for Consult:ab pain Referring Physician: Dr Claybon Jabs Melanie Clark is an 47 y.o. female.  HPI: 28 yof who has htn who has several weeks of ab pain ruq.  Initially had some brbpr that was evaluated in er and she was found to have colitis by ct scan. Treated with abx and this has stopped. She has seen GI in  and they have endoscopy planned.  She continues to have ruq pain and is not able to eat. Associated with nausea. Nothing making it better. [place] and ct scan show no real abnormality. Had a hida scan that shows low ef. She was told to come to er.    Past Medical History:  Diagnosis Date   GERD (gastroesophageal reflux disease)    Hypertension     Past Surgical History:  Procedure Laterality Date   CESAREAN SECTION      Family History  Problem Relation Age of Onset   Hypertension Mother    Hypertension Father    Diabetes Other    Hypertension Other    Cancer Other    Hypertension Other    Hypertension Paternal Grandmother    Hypertension Paternal Grandfather     Social History:  reports that she has never smoked. She has never used smokeless tobacco. She reports current alcohol use. She reports that she does not use drugs.  Allergies: No Known Allergies  Medications: I have reviewed the patient's current medications.  Results for orders placed or performed during the hospital encounter of 09/12/23 (from the past 48 hours)  CBC with Differential     Status: Abnormal   Collection Time: 09/12/23  8:19 PM  Result Value Ref Range   WBC 10.9 (H) 4.0 - 10.5 K/uL   RBC 4.79 3.87 - 5.11 MIL/uL   Hemoglobin 14.3 12.0 - 15.0 g/dL   HCT 40.9 81.1 - 91.4 %   MCV 85.6 80.0 - 100.0 fL   MCH 29.9 26.0 - 34.0 pg   MCHC 34.9 30.0 - 36.0 g/dL   RDW 78.2 (L) 95.6 - 21.3 %   Platelets 468 (H) 150 - 400 K/uL   nRBC 0.0 0.0 - 0.2 %   Neutrophils Relative % 71 %   Neutro Abs 7.7 1.7 - 7.7 K/uL   Lymphocytes Relative 17 %   Lymphs Abs 1.8 0.7 - 4.0 K/uL   Monocytes  Relative 9 %   Monocytes Absolute 1.0 0.1 - 1.0 K/uL   Eosinophils Relative 2 %   Eosinophils Absolute 0.2 0.0 - 0.5 K/uL   Basophils Relative 0 %   Basophils Absolute 0.0 0.0 - 0.1 K/uL   Immature Granulocytes 1 %   Abs Immature Granulocytes 0.11 (H) 0.00 - 0.07 K/uL    Comment: Performed at [facility], [address] [place], [place] [address]  Lipase, blood     Status: Abnormal   Collection Time: 09/12/23  8:19 PM  Result Value Ref Range   Lipase 70 (H) 11 - 51 U/L    Comment: Performed at [facility], [address], [place], [place] [address]  Comprehensive metabolic panel     Status: Abnormal   Collection Time: 09/12/23  8:19 PM  Result Value Ref Range   Sodium 125 (L) 135 - 145 mmol/L   Potassium 3.7 3.5 - 5.1 mmol/L   Chloride 89 (L) 98 - 111 mmol/L   CO2 24 22 - 32 mmol/L   Glucose, Bld 123 (H) 70 - 99 mg/dL  Comment: Glucose reference range applies only to samples taken after fasting for at least 8 hours.   BUN 13 6 - 20 mg/dL   Creatinine, Ser 3.08 0.44 - 1.00 mg/dL   Calcium 8.9 8.9 - 65.7 mg/dL   Total Protein 7.5 6.5 - 8.1 g/dL   Albumin 4.4 3.5 - 5.0 g/dL   AST 30 15 - 41 U/L   ALT 43 0 - 44 U/L   Alkaline Phosphatase 54 38 - 126 U/L   Total Bilirubin 0.7 <1.2 mg/dL   GFR, Estimated >84 >69 mL/min    Comment: (NOTE) Calculated using the CKD-EPI Creatinine Equation (2021)    Anion gap 12 5 - 15    Comment: Performed at [facility], [address], [place], [place] [address]  Comprehensive metabolic panel     Status: Abnormal   Collection Time: 09/13/23  5:03 AM  Result Value Ref Range   Sodium 128 (L) 135 - 145 mmol/L   Potassium 3.9 3.5 - 5.1 mmol/L   Chloride 92 (L) 98 - 111 mmol/L   CO2 25 22 - 32 mmol/L   Glucose, Bld 109 (H) 70 - 99 mg/dL    Comment: Glucose reference range applies only to samples taken after fasting for at least 8 hours.   BUN 14 6 - 20 mg/dL   Creatinine, Ser 8.41 0.44  - 1.00 mg/dL   Calcium 9.0 8.9 - 32.4 mg/dL   Total Protein 7.3 6.5 - 8.1 g/dL   Albumin 4.4 3.5 - 5.0 g/dL   AST 28 15 - 41 U/L   ALT 39 0 - 44 U/L   Alkaline Phosphatase 55 38 - 126 U/L   Total Bilirubin 0.7 <1.2 mg/dL   GFR, Estimated >40 >10 mL/min    Comment: (NOTE) Calculated using the CKD-EPI Creatinine Equation (2021)    Anion gap 11 5 - 15    Comment: Performed at [facility], [address], [place], [place] [address]  Magnesium     Status: None   Collection Time: 09/13/23  5:03 AM  Result Value Ref Range   Magnesium 2.2 1.7 - 2.4 mg/dL    Comment: Performed at [facility], [address], [place], [place] [address]  CBC with Differential/Platelet     Status: Abnormal   Collection Time: 09/13/23  5:03 AM  Result Value Ref Range   WBC 13.2 (H) 4.0 - 10.5 K/uL   RBC 4.66 3.87 - 5.11 MIL/uL   Hemoglobin 13.9 12.0 - 15.0 g/dL   HCT 34.7 42.5 - 95.6 %   MCV 86.1 80.0 - 100.0 fL   MCH 29.8 26.0 - 34.0 pg   MCHC 34.7 30.0 - 36.0 g/dL   RDW 38.7 56.4 - 33.2 %   Platelets 520 (H) 150 - 400 K/uL   nRBC 0.0 0.0 - 0.2 %   Neutrophils Relative % 73 %   Neutro Abs 9.7 (H) 1.7 - 7.7 K/uL   Lymphocytes Relative 16 %   Lymphs Abs 2.1 0.7 - 4.0 K/uL   Monocytes Relative 9 %   Monocytes Absolute 1.2 (H) 0.1 - 1.0 K/uL   Eosinophils Relative 1 %   Eosinophils Absolute 0.1 0.0 - 0.5 K/uL   Basophils Relative 0 %   Basophils Absolute 0.0 0.0 - 0.1 K/uL   Immature Granulocytes 1 %   Abs Immature Granulocytes 0.13 (H) 0.00 - 0.07 K/uL    Comment: Performed at [facility], [address] [place], [place] [address]    NM Hepato W/Eject Fract Result Date: 09/12/2023 CLINICAL DATA:  RIGHT upper quadrant pain. EXAM: NUCLEAR MEDICINE HEPATOBILIARY IMAGING WITH GALLBLADDER EF TECHNIQUE: Sequential images of the abdomen were obtained out to 60 minutes following intravenous administration of radiopharmaceutical. After slow  intravenous infusion of 1.6 micrograms Cholecystokinin, gallbladder ejection fraction was determined. RADIOPHARMACEUTICALS:  5.0 mCi Tc-46m Choletec IV COMPARISON:  CT 09/08/2023 FINDINGS: Prompt uptake and biliary excretion of activity by the liver is seen. Gallbladder activity is visualized, consistent with patency of cystic duct. Biliary activity passes into small bowel, consistent with patent common bile duct. Administration of CCK resulted in minimal gallbladder contraction. Ejection fraction less than 5%. Some movement of patient distorts the graphic interpretation. Visually minimal contraction. Calculated gallbladder ejection fraction is less than%. (At 60 min, normal ejection fraction is greater than 40% and less than 80%.) IMPRESSION: 1. Patent cystic duct and common bile duct. 2. Minimal gallbladder contraction with fatty meal stimulation. Estimated less than 5 % EF. Electronically Signed   By: Genevive Bi M.D.   On: 09/12/2023 12:06    Review of Systems  Gastrointestinal:  Positive for abdominal pain, blood in stool and nausea.  All other systems reviewed and are negative.  Blood pressure (!) 141/94, pulse 81, temperature 98.6 F (37 C), resp. rate 14, weight 78 kg, SpO2 98%. Physical Exam Vitals reviewed.  Constitutional:      Appearance: She is well-developed.  HENT:     Head: Normocephalic and atraumatic.  Eyes:     General: No scleral icterus.    Extraocular Movements: Extraocular movements intact.  Cardiovascular:     Rate and Rhythm: Normal rate and regular rhythm.  Pulmonary:     Effort: Pulmonary effort is normal.     Breath sounds: No wheezing.  Abdominal:     General: There is no distension.     Palpations: Abdomen is soft.     Tenderness: There is abdominal tenderness in the right upper quadrant. Negative signs include Murphy's sign.     Hernia: No hernia is present.  Skin:    General: Skin is warm and dry.     Capillary Refill: Capillary refill takes less  than 2 seconds.  Neurological:     General: No focal deficit present.     Mental Status: She is alert.  Psychiatric:        Mood and Affect: Mood normal.        Behavior: Behavior normal.     Assessment/Plan: Laparoscopic cholecystectomy  -unsure of how colitis and brb are related to this but I do think she has pain related to gb although she has no stones on [place] or ct scan -she has GI follow up setup and the brb has now stopped -reasonable to do lap chole. There is chance given imaging etc that surgery might not help symptoms but I think likely it will -I discussed the procedure in detail.  We discussed the risks and benefits of a laparoscopic cholecystectomy and possible cholangiogram including, but not limited to bleeding, infection, injury to surrounding structures such as the intestine or liver, bile leak, retained gallstones, need to convert to an open procedure, prolonged diarrhea, blood clots such as  DVT, common bile duct injury, anesthesia risks, and possible need for additional procedures.  The likelihood of improvement in symptoms and return to the patient's normal status is good. We discussed the typical post-operative recovery course.   Melanie Clark 09/13/2023, 7:00 AM

## 2023-09-13 NOTE — Discharge Instructions (Signed)

## 2023-09-13 NOTE — ED Notes (Signed)
PT has been notifying EMT of increased pain for the last hour, nurse was notified and vitals were taken

## 2023-09-13 NOTE — ED Provider Notes (Signed)
[place] EMERGENCY DEPARTMENT AT [facility] Provider Note   CSN: [id number] Arrival date & time: 09/12/23  1937     History  Chief Complaint  Patient presents with   Abdominal Pain    Melanie Clark is a 47 y.o. female.  Patient presents to the emergency department complaining of right upper quadrant abdominal pain which began this evening after eating a dinner consisting of sauted chicken and vegetables.  She has associated nausea, vomiting, diarrhea.  Patient took 2 oxycodone and Compazine 1 hour prior to emergency department arrival without relief of symptoms.  She states she has been having intermittent pain over the past few weeks of similar nature.  It is continuing to worsen in severity.  She reports having a HIDA scan on Friday.  Review of HIDA scan shows 5% ejection fraction.  Patient denies chest pain, shortness of breath, fevers.  Past medical history significant for hypertension, GERD   Abdominal Pain      Home Medications Prior to Admission medications   Medication Sig Start Date End Date Taking? Authorizing Provider  atenolol (TENORMIN) 100 MG tablet Take 1 tablet (100 mg total) by mouth daily. 05/05/23  Yes Cook, Jayce G, DO  buPROPion (WELLBUTRIN SR) 150 MG 12 hr tablet Take 1 tablet (150 mg total) by mouth 2 (two) times daily. 05/05/23  Yes Cook, Jayce G, DO  dicyclomine (BENTYL) 20 MG tablet Take 1 tablet (20 mg total) by mouth 2 (two) times daily. Patient taking differently: Take 20 mg by mouth 2 (two) times daily as needed for spasms. 08/19/23  Yes Sabas Sous, MD  enalapril (VASOTEC) 20 MG tablet Take 1 tablet (20 mg total) by mouth daily. 05/05/23  Yes Cook, Jayce G, DO  oxyCODONE-acetaminophen (PERCOCET) 5-325 MG tablet Take 1-2 tablets by mouth every 6 (six) hours as needed for severe pain (pain score 7-10). 09/11/23  Yes Cook, Jayce G, DO  pantoprazole (PROTONIX) 40 MG tablet Take 1 tablet (40 mg total) by mouth daily. 09/09/23  Yes Cook,  Jayce G, DO  prochlorperazine (COMPAZINE) 10 MG tablet Take 1 tablet (10 mg total) by mouth every 6 (six) hours as needed for nausea or vomiting. 09/09/23  Yes Cook, Jayce G, DO  ZEPBOUND 5 MG/0.5ML Pen Inject 5 mg into the skin once a week. Patient not taking: Reported on 09/13/2023 08/22/23   [provider]      Allergies    Patient has no known allergies.    Review of Systems   Review of Systems  Gastrointestinal:  Positive for abdominal pain.    Physical Exam Updated Vital Signs BP (!) 150/96   Pulse 81   Temp (!) 97.5 F (36.4 C) (Oral)   Resp 14   Wt 78 kg   SpO2 98%   BMI 29.52 kg/m  Physical Exam Vitals and nursing note reviewed.  Constitutional:      General: She is in acute distress.     Appearance: She is well-developed.  HENT:     Head: Normocephalic and atraumatic.  Eyes:     Conjunctiva/sclera: Conjunctivae normal.  Cardiovascular:     Rate and Rhythm: Normal rate and regular rhythm.  Pulmonary:     Effort: Pulmonary effort is normal. No respiratory distress.     Breath sounds: Normal breath sounds.  Abdominal:     Palpations: Abdomen is soft.     Tenderness: There is abdominal tenderness in the right upper quadrant and epigastric area.  Musculoskeletal:  General: No swelling.     Cervical back: Neck supple.  Skin:    General: Skin is warm and dry.     Capillary Refill: Capillary refill takes less than 2 seconds.  Neurological:     Mental Status: She is alert.  Psychiatric:        Mood and Affect: Mood normal.     ED Results / Procedures / Treatments   Labs (all labs ordered are listed, but only abnormal results are displayed) Labs Reviewed  CBC WITH DIFFERENTIAL/PLATELET - Abnormal; Notable for the following components:      Result Value   WBC 10.9 (*)    RDW 11.4 (*)    Platelets 468 (*)    Abs Immature Granulocytes 0.11 (*)    All other components within normal limits  LIPASE, BLOOD - Abnormal; Notable for the  following components:   Lipase 70 (*)    All other components within normal limits  COMPREHENSIVE METABOLIC PANEL - Abnormal; Notable for the following components:   Sodium 125 (*)    Chloride 89 (*)    Glucose, Bld 123 (*)    All other components within normal limits    EKG None  Radiology NM Hepato W/Eject Fract Result Date: 09/12/2023 CLINICAL DATA:  RIGHT upper quadrant pain. EXAM: NUCLEAR MEDICINE HEPATOBILIARY IMAGING WITH GALLBLADDER EF TECHNIQUE: Sequential images of the abdomen were obtained out to 60 minutes following intravenous administration of radiopharmaceutical. After slow intravenous infusion of 1.6 micrograms Cholecystokinin, gallbladder ejection fraction was determined. RADIOPHARMACEUTICALS:  5.0 mCi Tc-53m Choletec IV COMPARISON:  CT 09/08/2023 FINDINGS: Prompt uptake and biliary excretion of activity by the liver is seen. Gallbladder activity is visualized, consistent with patency of cystic duct. Biliary activity passes into small bowel, consistent with patent common bile duct. Administration of CCK resulted in minimal gallbladder contraction. Ejection fraction less than 5%. Some movement of patient distorts the graphic interpretation. Visually minimal contraction. Calculated gallbladder ejection fraction is less than%. (At 60 min, normal ejection fraction is greater than 40% and less than 80%.) IMPRESSION: 1. Patent cystic duct and common bile duct. 2. Minimal gallbladder contraction with fatty meal stimulation. Estimated less than 5 % EF. Electronically Signed   By: Genevive Bi M.D.   On: 09/12/2023 12:06    Procedures Procedures    Medications Ordered in ED Medications  morphine (PF) 4 MG/ML injection 4 mg (4 mg Intravenous Given 09/13/23 0132)  prochlorperazine (COMPAZINE) injection 10 mg (10 mg Intravenous Given 09/13/23 0131)    ED Course/ Medical Decision Making/ A&P                                 Medical Decision Making Amount and/or Complexity of  Data Reviewed Radiology: ordered.  Risk Prescription drug management.   This patient presents to the ED for concern of right upper quadrant abdominal pain, this involves an extensive number of treatment options, and is a complaint that carries with it a high risk of complications and morbidity.  The differential diagnosis includes cholecystitis, acalculous cholecystitis, ascending cholangitis, others   Co morbidities that complicate the patient evaluation  Hypertension, GERD   Additional history obtained:  Additional history obtained from family External records from outside source obtained and reviewed including primary care notes and a HIDA scan performed as an outpatient showing 5% ejection fraction   Lab Tests:  I Ordered, and personally interpreted labs.  The pertinent results include: Mild leukocytosis  at 10,900, lipase 70 (37 5 days ago, 32 to 3 weeks ago), sodium 125 (128 5 days ago)   Consultations Obtained:  I requested consultation with the general surgeon, Dr. Dwain Sarna,  and discussed lab and imaging findings as well as pertinent plan - they recommend: Further evaluation by gastroenterology  I requested consultation with the hospitalist, Dr. Joneen Roach who agrees to see patient for admission  I messaged Dr. Marca Ancona with [facility] gastroenterology to have patient added to rounding list.   Problem List / ED Course / Critical interventions / Medication management   I ordered medication including morphine and Compazine for abdominal pain and nausea Reevaluation of the patient after these medicines showed that the patient improved I have reviewed the patients home medicines and have made adjustments as needed   Test / Admission - Considered:  Patient with HIDA scan concerning for 5% ejection fraction. Lipase slightly worsened. Patient needs admission for further evaluation by GI and surgery. Patient admitted to medicine service.          Final Clinical  Impression(s) / ED Diagnoses Final diagnoses:  Abnormal biliary HIDA scan  RUQ pain    Rx / DC Orders ED Discharge Orders     None         Pamala Duffel 09/13/23 0340    Laurence Spates, MD 09/13/23 2249

## 2023-09-13 NOTE — Anesthesia Postprocedure Evaluation (Signed)
Anesthesia Post Note  Patient: Melanie Clark  Procedure(s) Performed: LAPAROSCOPIC CHOLECYSTECTOMY WITH ICG     Patient location during evaluation: PACU Anesthesia Type: General Level of consciousness: awake Pain management: pain level controlled Vital Signs Assessment: post-procedure vital signs reviewed and stable Respiratory status: spontaneous breathing, nonlabored ventilation and respiratory function stable Cardiovascular status: blood pressure returned to baseline and stable Postop Assessment: no apparent nausea or vomiting Anesthetic complications: no   No notable events documented.  Last Vitals:  Vitals:   09/13/23 0955 09/13/23 1000  BP:  138/88  Pulse: 79 79  Resp: 13 13  Temp:    SpO2: 100% 99%    Last Pain:  Vitals:   09/13/23 0955  TempSrc:   PainSc: 0-No pain                 Monda Chastain P Nuala Chiles

## 2023-09-13 NOTE — ED Notes (Signed)
Pt ambulated to bathroom, provided urine sample if needed. Sample in room.

## 2023-09-13 NOTE — Op Note (Signed)
Preoperative diagnosis: biliary dyskinesia Postoperative diagnosis: Same as above Procedure: Laparoscopic cholecystectomy Surgeon: Dr. Harden Mo Anesthesia: General Estimated blood loss: Minimal Complications: None Drains: None Specimens: Gallbladder and contents to pathology Sponge needle count was correct at completion Disposition recovery in stable condition     Indications: 53 yof who has htn who has several weeks of ab pain ruq. Initially had some brbpr that was evaluated in er and she was found to have colitis by ct scan. Treated with abx and this has stopped. She has seen GI in [place] and they have endoscopy planned. She continues to have ruq pain and is not able to eat. Associated with nausea. Nothing making it better. [place] and ct scan show no real abnormality. Had a hida scan that shows low ef. She was told to come to er. We discussed proceeding to the OR for lap chole.    Procedure: After informed consent was obtained she was taken to the operating room.  She was given antibiotics.  SCDs were in place.  She was given ICG dye.  She was placed under general anesthesia without complication.  She was prepped and draped in a standard sterile surgical fashion.  A surgical timeout was then performed.   I infiltrated Marcaine below her umbilicus.  I made an infraumbilical incision.  I grasped the fascia and incised this sharply.  I entered the peritoneum bluntly.  This was done without injury.  I placed a 0 Vicryl pursestring suture through the fascia and inserted a Hassan trocar.  I insufflated the abdomen to 15 mmHg pressure.  I then inserted 3 additional 5 mm trocars in the epigastrium and right upper quadrant without difficulty.  The gallbladder was fairly normal appearing.  The gallbladder was then retracted cephalad and lateral.  I was able to dissect in the triangle and clearly obtained a wide critical view of safety.  I did use the ICG dye and this confirmed that I was well away  from the bile duct.   I then clipped the artery and divided it leaving 2 clips in place.  I then placed three clips distally and one proximally on the cystic duct. .  The clips completely traversed the duct and the duct was viable.  I then removed the gallbladder from the liver bed.  This was placed in a retrieval bag and removed.  I obtained hemostasis.  Irrigation was performed.  I then removed the [facility] trocar and tied the pursestring down.  I placed an additional 0 Vicryl suture through the fascia and completely obliterated that defect.  The remaining trocars were then removed and the abdomen desufflated.  These were closed with 4-0 Monocryl and glue.  She tolerated it well was extubated and transferred recovery stable.

## 2023-09-13 NOTE — Anesthesia Procedure Notes (Signed)
Procedure Name: Intubation Date/Time: 09/13/2023 8:25 AM  Performed by: Vanessa Montgomery, CRNAPre-anesthesia Checklist: Patient identified, Emergency Drugs available, Suction available and Patient being monitored Patient Re-evaluated:Patient Re-evaluated prior to induction Oxygen Delivery Method: Circle system utilized Preoxygenation: Pre-oxygenation with 100% oxygen Induction Type: IV induction, Rapid sequence and Cricoid Pressure applied Laryngoscope Size: 2 and Miller Grade View: Grade I Tube type: Oral Tube size: 7.0 mm Number of attempts: 1 Airway Equipment and Method: Stylet Placement Confirmation: ETT inserted through vocal cords under direct vision, positive ETCO2 and breath sounds checked- equal and bilateral Secured at: 21 cm Tube secured with: Tape Dental Injury: Teeth and Oropharynx as per pre-operative assessment

## 2023-09-14 ENCOUNTER — Encounter (HOSPITAL_COMMUNITY): Payer: Self-pay | Admitting: General Surgery

## 2023-09-14 NOTE — Discharge Summary (Signed)
Physician Discharge Summary  Patient ID: Melanie Clark MRN: [id number] DOB/AGE: May 09, 1976 47 y.o.  Admit date: 09/12/2023 Discharge date: 09/14/2023  Admission Diagnoses: RUQ pain Discharge Diagnoses:  Principal Problem:   Abdominal pain Active Problems:   Essential hypertension, benign   Discharged Condition: good  Hospital Course: 47 yo otherwise healthy female seen for ruq pain and abnormal hida. Symptoms consistent with biliary disease although no stones.  Proceeded for uneventful lap chole and she was discharged from pacu doing well after criteria met  Consults: None  Significant Diagnostic Studies: none  Treatments: surgery: lap chole   Disposition: Discharge disposition: 01-Home or Self Care        Allergies as of 09/13/2023   No Known Allergies      Medication List     TAKE these medications    atenolol 100 MG tablet Commonly known as: TENORMIN Take 1 tablet (100 mg total) by mouth daily.   buPROPion 150 MG 12 hr tablet Commonly known as: WELLBUTRIN SR Take 1 tablet (150 mg total) by mouth 2 (two) times daily.   dicyclomine 20 MG tablet Commonly known as: BENTYL Take 1 tablet (20 mg total) by mouth 2 (two) times daily. What changed:  when to take this reasons to take this   enalapril 20 MG tablet Commonly known as: VASOTEC Take 1 tablet (20 mg total) by mouth daily.   oxyCODONE 5 MG immediate release tablet Commonly known as: Oxy IR/ROXICODONE Take 1 tablet (5 mg total) by mouth every 6 (six) hours as needed for moderate pain (pain score 4-6), severe pain (pain score 7-10) or breakthrough pain.   oxyCODONE-acetaminophen 5-325 MG tablet Commonly known as: Percocet Take 1-2 tablets by mouth every 6 (six) hours as needed for severe pain (pain score 7-10).   pantoprazole 40 MG tablet Commonly known as: PROTONIX Take 1 tablet (40 mg total) by mouth daily.   prochlorperazine 10 MG tablet Commonly known as: COMPAZINE Take 1  tablet (10 mg total) by mouth every 6 (six) hours as needed for nausea or vomiting.   Zepbound 5 MG/0.5ML Pen Generic drug: tirzepatide Inject 5 mg into the skin once a week.        Follow-up Information     Emelia Loron, MD Follow up in 3 week(s).   Specialty: General Surgery Why: office will call you this week with follow up Contact information: [address] [place] [address] [phone]                 Signed: Emelia Loron 09/14/2023, 6:55 AM

## 2023-09-16 ENCOUNTER — Other Ambulatory Visit (HOSPITAL_COMMUNITY)
Admission: RE | Admit: 2023-09-16 | Discharge: 2023-09-16 | Disposition: A | Discharge status: Home / Self Care | Payer: BC Managed Care – PPO | Source: Ambulatory Visit | Attending: Family Medicine | Admitting: Family Medicine

## 2023-09-16 ENCOUNTER — Ambulatory Visit: Payer: BC Managed Care – PPO | Admitting: General Surgery

## 2023-09-16 ENCOUNTER — Other Ambulatory Visit: Payer: Self-pay

## 2023-09-16 ENCOUNTER — Other Ambulatory Visit (HOSPITAL_COMMUNITY): Payer: BC Managed Care – PPO

## 2023-09-16 ENCOUNTER — Ambulatory Visit: Payer: BC Managed Care – PPO | Admitting: Family Medicine

## 2023-09-16 ENCOUNTER — Ambulatory Visit (HOSPITAL_COMMUNITY)
Admission: RE | Admit: 2023-09-16 | Discharge: 2023-09-16 | Disposition: A | Discharge status: Home / Self Care | Payer: BC Managed Care – PPO | Source: Ambulatory Visit | Attending: Family Medicine | Admitting: Family Medicine

## 2023-09-16 ENCOUNTER — Encounter (HOSPITAL_BASED_OUTPATIENT_CLINIC_OR_DEPARTMENT_OTHER): Payer: Self-pay | Admitting: Emergency Medicine

## 2023-09-16 VITALS — BP 155/97 | HR 74 | Temp 98.1°F | Ht 64.0 in | Wt 171.0 lb

## 2023-09-16 DIAGNOSIS — E871 Hypo-osmolality and hyponatremia: Secondary | ICD-10-CM | POA: Insufficient documentation

## 2023-09-16 DIAGNOSIS — K5900 Constipation, unspecified: Secondary | ICD-10-CM | POA: Insufficient documentation

## 2023-09-16 DIAGNOSIS — R112 Nausea with vomiting, unspecified: Secondary | ICD-10-CM | POA: Insufficient documentation

## 2023-09-16 LAB — COMPREHENSIVE METABOLIC PANEL
ALT: 28 U/L (ref 0–44)
ALT: 25 U/L (ref 0–44)
AST: 19 U/L (ref 15–41)
AST: 18 U/L (ref 15–41)
Albumin: 3.8 g/dL (ref 3.5–5.0)
Albumin: 4.3 g/dL (ref 3.5–5.0)
Alkaline Phosphatase: 49 U/L (ref 38–126)
Alkaline Phosphatase: 47 U/L (ref 38–126)
Anion gap: 8 (ref 5–15)
Anion gap: 9 (ref 5–15)
BUN: 12 mg/dL (ref 6–20)
BUN: 12 mg/dL (ref 6–20)
CO2: 27 mmol/L (ref 22–32)
CO2: 28 mmol/L (ref 22–32)
Calcium: 9 mg/dL (ref 8.9–10.3)
Calcium: 9.5 mg/dL (ref 8.9–10.3)
Chloride: 94 mmol/L — ABNORMAL LOW (ref 98–111)
Chloride: 94 mmol/L — ABNORMAL LOW (ref 98–111)
Creatinine, Ser: 0.58 mg/dL (ref 0.44–1.00)
Creatinine, Ser: 0.67 mg/dL (ref 0.44–1.00)
GFR, Estimated: >60 mL/min (ref >60)
GFR, Estimated: >60 mL/min (ref >60)
Glucose, Bld: 114 mg/dL — ABNORMAL HIGH (ref 70–99)
Glucose, Bld: 119 mg/dL — ABNORMAL HIGH (ref 70–99)
Potassium: 4.5 mmol/L (ref 3.5–5.1)
Potassium: 4.5 mmol/L (ref 3.5–5.1)
Sodium: 129 mmol/L — ABNORMAL LOW (ref 135–145)
Sodium: 131 mmol/L — ABNORMAL LOW (ref 135–145)
Total Bilirubin: 0.5 mg/dL (ref <1.2)
Total Bilirubin: 0.7 mg/dL (ref <1.2)
Total Protein: 6.6 g/dL (ref 6.5–8.1)
Total Protein: 6.8 g/dL (ref 6.5–8.1)

## 2023-09-16 LAB — CBC
HCT: 39.4 % (ref 36.0–46.0)
Hemoglobin: 13.6 g/dL (ref 12.0–15.0)
MCH: 29.8 pg (ref 26.0–34.0)
MCHC: 34.5 g/dL (ref 30.0–36.0)
MCV: 86.2 fL (ref 80.0–100.0)
Platelets: 544 10*3/uL — ABNORMAL HIGH (ref 150–400)
RBC: 4.57 MIL/uL (ref 3.87–5.11)
RDW: 11.9 % (ref 11.5–15.5)
WBC: 10 10*3/uL (ref 4.0–10.5)
nRBC: 0 % (ref 0.0–0.2)

## 2023-09-16 LAB — LIPASE, BLOOD: Lipase: 62 U/L — ABNORMAL HIGH (ref 11–51)

## 2023-09-16 LAB — SURGICAL PATHOLOGY

## 2023-09-16 MED ORDER — ONDANSETRON 8 MG PO TBDP: 8.0000 mg | ORAL_TABLET | Freq: Three times a day (TID) | ORAL | 0 refills | Status: DC | PRN

## 2023-09-16 NOTE — Patient Instructions (Signed)
Xray and lab at the hospital.  Zofran as directed.  I will call with results.  Take care  Dr. Adriana Simas

## 2023-09-16 NOTE — ED Triage Notes (Signed)
Patient presents with nausea and vomiting x 2- 3 days. Patient reports recent cholecystectomy and thinks sx related to same.   Patient also reports constipation. Last BM approx 1-2 weeks ago

## 2023-09-16 NOTE — Assessment & Plan Note (Addendum)
Recent hospital course reviewed.  Recent labs reviewed.  Metabolic panel ordered today and revealed hyponatremia.  KUB obtained today to assess for constipation and possible obstruction/ileus.  Awaiting read from radiology.  Zofran as needed for nausea.

## 2023-09-16 NOTE — Progress Notes (Addendum)
Subjective:  Patient ID: Melanie Clark, female    DOB: [birth year]  Age: 47 y.o. MRN: [id number]  CC:   Chief Complaint  Patient presents with   RUQ    Follow up    HPI:  47 year old female presents for follow-up.  Patient recently seen by me as well as GI.  HIDA scan was obtained and revealed low ejection fraction.  Before she could see general surgery as an outpatient she went to the hospital and was admitted and subsequently had cholecystectomy given the severity of her symptoms.  Patient admitted from 12/13 to 12/15.  Cholecystectomy was done on 12/14.  Patient presents today for follow-up.  She states that she is not having much abdominal pain but as of last night had severe nausea and vomiting.  She is having significant constipation as well.  She states that she is not keeping much down.  No fever.  She does not have follow-up with general surgery until January.  Patient Active Problem List   Diagnosis Date Noted   Nausea and vomiting 09/16/2023   RUQ pain 09/09/2023   Epigastric pain 09/09/2023   GERD (gastroesophageal reflux disease) 12/06/2021   Depression, major, single episode, mild (HCC) 11/17/2017   Essential hypertension, benign 01/06/2013    Social Hx   Social History   Socioeconomic History   Marital status: Unknown    Spouse name: Not on file   Number of children: Not on file   Years of education: Not on file   Highest education level: Not on file  Occupational History   Not on file  Tobacco Use   Smoking status: Never   Smokeless tobacco: Never  Substance and Sexual Activity   Alcohol use: Yes    Comment: social   Drug use: No   Sexual activity: Not on file  Other Topics Concern   Not on file  Social History Narrative   Not on file   Social Drivers of Health   Financial Resource Strain: Not on file  Food Insecurity: Not on file  Transportation Needs: Not on file  Physical Activity: Not on file  Stress: Not on file  Social  Connections: Not on file    Review of Systems Per HPI  Objective:  BP (!) 155/97   Pulse 74   Temp 98.1 F (36.7 C)   Ht 5\' 4"  (1.626 m)   Wt 171 lb (77.6 kg)   SpO2 99%   BMI 29.35 kg/m      09/16/2023   10:02 AM 09/16/2023   10:00 AM 09/13/2023   10:00 AM  BP/Weight  Systolic BP 155 148 138  Diastolic BP 97 96 88  Wt. (Lbs)  171   BMI  29.35 kg/m2     Physical Exam Vitals and nursing note reviewed.  Constitutional:      General: She is not in acute distress.    Appearance: Normal appearance.  HENT:     Head: Normocephalic and atraumatic.  Cardiovascular:     Rate and Rhythm: Normal rate and regular rhythm.  Pulmonary:     Effort: Pulmonary effort is normal.     Breath sounds: Normal breath sounds. No wheezing, rhonchi or rales.  Abdominal:     General: There is no distension.     Palpations: Abdomen is soft.     Comments: No focal tenderness.  Incisions healing well.  Neurological:     Mental Status: She is alert.     Lab Results  Component Value  Date   WBC 13.2 (H) 09/13/2023   HGB 13.9 09/13/2023   HCT 40.1 09/13/2023   PLT 520 (H) 09/13/2023   GLUCOSE 114 (H) 09/16/2023   CHOL 209 (H) 07/08/2023   TRIG 113 07/08/2023   HDL 59 07/08/2023   LDLCALC 130 (H) 07/08/2023   ALT 28 09/16/2023   AST 19 09/16/2023   NA 129 (L) 09/16/2023   K 4.5 09/16/2023   CL 94 (L) 09/16/2023   CREATININE 0.58 09/16/2023   BUN 12 09/16/2023   CO2 27 09/16/2023   TSH 3.080 10/26/2019     Assessment & Plan:   Problem List Items Addressed This Visit       Digestive   Nausea and vomiting - Primary   Recent hospital course reviewed.  Recent labs reviewed.  Metabolic panel ordered today and revealed hyponatremia.  KUB obtained today to assess for constipation and possible obstruction/ileus.  Awaiting read from radiology.  Zofran as needed for nausea.      Relevant Orders   DG Abd 1 View   Comprehensive metabolic panel    Meds ordered this encounter   Medications   ondansetron (ZOFRAN-ODT) 8 MG disintegrating tablet    Sig: Take 1 tablet (8 mg total) by mouth every 8 (eight) hours as needed for nausea or vomiting.    Dispense:  20 tablet    Refill:  0    Follow-up:  Return if symptoms worsen or fail to improve.  Everlene Other DO [facility]

## 2023-09-16 NOTE — Addendum Note (Signed)
Addended by: Tommie Sams on: 09/16/2023 01:10 PM   Modules accepted: Level of Service

## 2023-09-17 ENCOUNTER — Emergency Department (HOSPITAL_BASED_OUTPATIENT_CLINIC_OR_DEPARTMENT_OTHER)
Admission: EM | Admit: 2023-09-17 | Discharge: 2023-09-17 | Disposition: A | Discharge status: Home / Self Care | Payer: BC Managed Care – PPO | Attending: Emergency Medicine | Admitting: Emergency Medicine

## 2023-09-17 DIAGNOSIS — R112 Nausea with vomiting, unspecified: Secondary | ICD-10-CM

## 2023-09-17 DIAGNOSIS — K5900 Constipation, unspecified: Secondary | ICD-10-CM

## 2023-09-17 LAB — URINALYSIS, ROUTINE W REFLEX MICROSCOPIC
Bilirubin Urine: NEGATIVE
Glucose, UA: NEGATIVE mg/dL
Hgb urine dipstick: NEGATIVE
Leukocytes,Ua: NEGATIVE
Nitrite: NEGATIVE
Protein, ur: NEGATIVE mg/dL
Specific Gravity, Urine: 1.006 (ref 1.005–1.030)
pH: 7 (ref 5.0–8.0)

## 2023-09-17 NOTE — ED Provider Notes (Signed)
[place] EMERGENCY DEPARTMENT AT [facility]  Provider Note  CSN: [id number] Arrival date & time: 09/16/23 2058  History Chief Complaint  Patient presents with  . Emesis    Melanie Clark is a 47 y.o. female here with mother for evaluation of nausea and vomiting for the last 2-3 days since she had a cholecystectomy over the weekend. She has not had any fever. Abdominal pain has been well controlled. She has not had a BM since prior to the surgery. She went to PCP office today and was given some zofran which helped some. KUB done then showed non obstructive pattern with colonic stool. She reports while waiting for a room in the ED, her symptoms have improved. She is not currently feeling nauseated. She had tried taking some mag citrate earlier in the day but vomited it up.    Home Medications Prior to Admission medications   Medication Sig Start Date End Date Taking? Authorizing Provider  atenolol (TENORMIN) 100 MG tablet Take 1 tablet (100 mg total) by mouth daily. 05/05/23   Tommie Sams, DO  buPROPion (WELLBUTRIN SR) 150 MG 12 hr tablet Take 1 tablet (150 mg total) by mouth 2 (two) times daily. 05/05/23   Tommie Sams, DO  dicyclomine (BENTYL) 20 MG tablet Take 1 tablet (20 mg total) by mouth 2 (two) times daily. Patient taking differently: Take 20 mg by mouth 2 (two) times daily as needed for spasms. 08/19/23   Sabas Sous, MD  enalapril (VASOTEC) 20 MG tablet Take 1 tablet (20 mg total) by mouth daily. 05/05/23   Tommie Sams, DO  ondansetron (ZOFRAN-ODT) 8 MG disintegrating tablet Take 1 tablet (8 mg total) by mouth every 8 (eight) hours as needed for nausea or vomiting. 09/16/23   Tommie Sams, DO  oxyCODONE (OXY IR/ROXICODONE) 5 MG immediate release tablet Take 1 tablet (5 mg total) by mouth every 6 (six) hours as needed for moderate pain (pain score 4-6), severe pain (pain score 7-10) or breakthrough pain. 09/13/23   Emelia Loron, MD  pantoprazole  (PROTONIX) 40 MG tablet Take 1 tablet (40 mg total) by mouth daily. 09/09/23   Tommie Sams, DO  prochlorperazine (COMPAZINE) 10 MG tablet Take 1 tablet (10 mg total) by mouth every 6 (six) hours as needed for nausea or vomiting. 09/09/23   Tommie Sams, DO     Allergies    Patient has no known allergies.   Review of Systems   Review of Systems Please see HPI for pertinent positives and negatives  Physical Exam BP 115/78   Pulse 75   Temp 98.2 F (36.8 C)   Resp 16   SpO2 98%   Physical Exam Vitals and nursing note reviewed.  Constitutional:      Appearance: Normal appearance.  HENT:     Head: Normocephalic and atraumatic.     Nose: Nose normal.     Mouth/Throat:     Mouth: Mucous membranes are moist.  Eyes:     Extraocular Movements: Extraocular movements intact.     Conjunctiva/sclera: Conjunctivae normal.  Cardiovascular:     Rate and Rhythm: Normal rate.  Pulmonary:     Effort: Pulmonary effort is normal.     Breath sounds: Normal breath sounds.  Abdominal:     General: Abdomen is flat.     Palpations: Abdomen is soft.     Tenderness: There is no abdominal tenderness. There is no guarding.     Comments: Healing  laparoscopy incisions  Musculoskeletal:        General: No swelling. Normal range of motion.     Cervical back: Neck supple.  Skin:    General: Skin is warm and dry.  Neurological:     General: No focal deficit present.     Mental Status: She is alert.  Psychiatric:        Mood and Affect: Mood normal.     ED Results / Procedures / Treatments   EKG None  Procedures Procedures  Medications Ordered in the ED Medications - No data to display  Initial Impression and Plan  Patient here with nausea and vomiting in the immediate post-op period. Her abdomen is benign, xray done outpatient earlier in the day without signs of SBO. No fever here. Nausea seems improved while waiting and she will attempt PO trial here, but otherwise she feels well  enough to go home. Recommend she take miralax, mag citrate to help with her constipation. She has zofran and compazine at home if needed.   ED Course   Clinical Course as of 09/17/23 0150  Wed Sep 17, 2023  0149 CBC is normal. CMP with mild hyponatremia, similar to previous. Lipase not significantly elevated. UA is clear.  [CS]    Clinical Course User Index [CS] Pollyann Savoy, MD     MDM Rules/Calculators/A&P Medical Decision Making Problems Addressed: Constipation, unspecified constipation type: acute illness or injury Nausea and vomiting, unspecified vomiting type: acute illness or injury  Amount and/or Complexity of Data Reviewed Labs: ordered. Decision-making details documented in ED Course.  Risk OTC drugs. Prescription drug management.     Final Clinical Impression(s) / ED Diagnoses Final diagnoses:  Nausea and vomiting, unspecified vomiting type  Constipation, unspecified constipation type    Rx / DC Orders ED Discharge Orders     None        Pollyann Savoy, MD 09/17/23 [phone]

## 2023-09-17 NOTE — ED Notes (Signed)
Pt . Given gingerale and crackers 

## 2023-09-27 ENCOUNTER — Other Ambulatory Visit: Payer: Self-pay | Admitting: Family Medicine

## 2023-10-01 ENCOUNTER — Other Ambulatory Visit: Payer: Self-pay | Admitting: Family Medicine

## 2023-10-22 ENCOUNTER — Ambulatory Visit: Payer: BC Managed Care – PPO | Admitting: Internal Medicine

## 2023-10-22 ENCOUNTER — Encounter: Payer: Self-pay | Admitting: Internal Medicine

## 2023-10-22 ENCOUNTER — Encounter: Payer: Self-pay | Admitting: *Deleted

## 2023-10-22 VITALS — BP 120/83 | HR 60 | Temp 97.5°F | Ht 64.0 in | Wt 171.8 lb

## 2023-10-22 DIAGNOSIS — Z9049 Acquired absence of other specified parts of digestive tract: Secondary | ICD-10-CM

## 2023-10-22 DIAGNOSIS — Z1211 Encounter for screening for malignant neoplasm of colon: Secondary | ICD-10-CM

## 2023-10-22 DIAGNOSIS — K811 Chronic cholecystitis: Secondary | ICD-10-CM

## 2023-10-22 DIAGNOSIS — K219 Gastro-esophageal reflux disease without esophagitis: Secondary | ICD-10-CM | POA: Diagnosis not present

## 2023-10-22 NOTE — Patient Instructions (Signed)
I am happy to hear that you are doing much better.  Continue on pantoprazole daily for your chronic acid reflux.  We will schedule you for screening colonoscopy today.  It was very nice seeing you again today.  Dr. Marletta Lor

## 2023-10-22 NOTE — Progress Notes (Signed)
Primary Care Physician:  Tommie Sams, DO Primary Gastroenterologist:  Dr. Marletta Lor  Chief Complaint  Patient presents with   Follow-up    Patient here today for a follow up on RUQ pain. Patient had her gallbladder removed 09/13/2023, and she is doing much better since. She says she thinks she needed to speak with you regarding her need for a TCS.    HPI:   Melanie Clark is a 48 y.o. female who presents to the clinic today for follow-up visit.  Previously seen 09/10/2023 as an urgent new consult for right upper quadrant pain, nausea vomiting.  Underwent HIDA scan which showed low ejection fraction of 5% with urgent referral to general surgery.  She presented to [facility] ER due to worsening of her symptoms prior to appointment.  Underwent cholecystectomy 09/13/2023.  Pathology with chronic cholecystitis.  Initially had some postoperative complications with constipation and nausea vomiting though this has resolved.  Today she states she feels great.  States she feels 100% better.  No longer having pain, nausea vomiting, bowels moving well.  Does have chronic acid reflux which is well-controlled on pantoprazole 40 mg daily.  No dysphagia odynophagia.  No epigastric or chest pain.  Needs to schedule screening colonoscopy.  No family history of colorectal malignancy.  No melena hematochezia.  No unintentional weight loss.   Past Medical History:  Diagnosis Date   GERD (gastroesophageal reflux disease)    Hypertension     Past Surgical History:  Procedure Laterality Date   CESAREAN SECTION     CHOLECYSTECTOMY N/A 09/13/2023   Procedure: LAPAROSCOPIC CHOLECYSTECTOMY WITH ICG;  Surgeon: Emelia Loron, MD;  Location: [facility] ORS;  Service: General;  Laterality: N/A;    Current Outpatient Medications  Medication Sig Dispense Refill   atenolol (TENORMIN) 100 MG tablet Take 1 tablet (100 mg total) by mouth daily. 90 tablet 3   buPROPion (WELLBUTRIN SR) 150 MG 12 hr  tablet Take 1 tablet (150 mg total) by mouth 2 (two) times daily. 180 tablet 3   enalapril (VASOTEC) 20 MG tablet Take 1 tablet (20 mg total) by mouth daily. 90 tablet 3   pantoprazole (PROTONIX) 40 MG tablet TAKE 1 TABLET BY MOUTH EVERY DAY 90 tablet 1   dicyclomine (BENTYL) 20 MG tablet Take 1 tablet (20 mg total) by mouth 2 (two) times daily. (Patient not taking: Reported on 10/22/2023) 20 tablet 0   ondansetron (ZOFRAN-ODT) 8 MG disintegrating tablet Take 1 tablet (8 mg total) by mouth every 8 (eight) hours as needed for nausea or vomiting. (Patient not taking: Reported on 10/22/2023) 20 tablet 0   prochlorperazine (COMPAZINE) 10 MG tablet TAKE 1 TABLET BY MOUTH EVERY 6 HOURS AS NEEDED FOR NAUSEA OR VOMITING. (Patient not taking: Reported on 10/22/2023) 90 tablet 0   No current facility-administered medications for this visit.    Allergies as of 10/22/2023   (No Known Allergies)    Family History  Problem Relation Age of Onset   Hypertension Mother    Hypertension Father    Diabetes Other    Hypertension Other    Cancer Other    Hypertension Other    Hypertension Paternal Grandmother    Hypertension Paternal Grandfather     Social History   Socioeconomic History   Marital status: Unknown    Spouse name: Not on file   Number of children: Not on file   Years of education: Not on file   Highest education level: Not on file  Occupational History   Not on file  Tobacco Use   Smoking status: Never   Smokeless tobacco: Never  Vaping Use   Vaping status: Never Used  Substance and Sexual Activity   Alcohol use: Yes    Comment: social   Drug use: No   Sexual activity: Not on file  Other Topics Concern   Not on file  Social History Narrative   Not on file   Social Drivers of Health   Financial Resource Strain: Not on file  Food Insecurity: Not on file  Transportation Needs: Not on file  Physical Activity: Not on file  Stress: Not on file  Social Connections: Not on  file  Intimate Partner Violence: Not on file    Subjective: Review of Systems  Constitutional:  Negative for chills and fever.  HENT:  Negative for congestion and hearing loss.   Eyes:  Negative for blurred vision and double vision.  Respiratory:  Negative for cough and shortness of breath.   Cardiovascular:  Negative for chest pain and palpitations.  Gastrointestinal:  Positive for abdominal pain, nausea and vomiting. Negative for blood in stool, constipation, diarrhea, heartburn and melena.  Genitourinary:  Negative for dysuria and urgency.  Musculoskeletal:  Negative for joint pain and myalgias.  Skin:  Negative for itching and rash.  Neurological:  Negative for dizziness and headaches.  Psychiatric/Behavioral:  Negative for depression. The patient is not nervous/anxious.        Objective: BP 120/83 (BP Location: Left Arm, Patient Position: Sitting, Cuff Size: Large)   Pulse 60   Temp (!) 97.5 F (36.4 C) (Temporal)   Ht 5\' 4"  (1.626 m)   Wt 171 lb 12.8 oz (77.9 kg)   LMP 12/23/2012   BMI 29.49 kg/m  Physical Exam Constitutional:      Appearance: Normal appearance.  HENT:     Head: Normocephalic and atraumatic.  Eyes:     Extraocular Movements: Extraocular movements intact.     Conjunctiva/sclera: Conjunctivae normal.  Cardiovascular:     Rate and Rhythm: Normal rate and regular rhythm.  Pulmonary:     Effort: Pulmonary effort is normal.     Breath sounds: Normal breath sounds.  Abdominal:     General: Bowel sounds are normal.     Palpations: Abdomen is soft.     Tenderness: There is abdominal tenderness in the right upper quadrant and epigastric area.  Musculoskeletal:        General: No swelling. Normal range of motion.     Cervical back: Normal range of motion and neck supple.  Skin:    General: Skin is warm and dry.     Coloration: Skin is not jaundiced.  Neurological:     General: No focal deficit present.     Mental Status: She is alert and oriented to  person, place, and time.  Psychiatric:        Mood and Affect: Mood normal.        Behavior: Behavior normal.      Assessment/Plan:  1.  Chronic GERD-well-controlled pantoprazole daily.  No alarm symptoms today to warrant further investigation with upper endoscopy.  2.  Colon cancer screening- Will schedule for screening colonoscopy.The risks including infection, bleed, or perforation as well as benefits, limitations, alternatives and imponderables have been reviewed with the patient. Questions have been answered. All parties agreeable.  3.  Chronic cholecystitis s/p CCY 09/13/2023-symptoms vastly improved.  Continue follow-up with general surgery.     10/22/2023 10:05 AM  Disclaimer: This note was dictated with voice recognition software. Similar sounding words can inadvertently be transcribed and may not be corrected upon review.

## 2023-11-03 MED ORDER — NA SULFATE-K SULFATE-MG SULF 17.5-3.13-1.6 GM/177ML PO SOLN: 1.0000 | ORAL | 0 refills | Status: DC

## 2023-11-05 ENCOUNTER — Ambulatory Visit: Payer: BLUE CROSS/BLUE SHIELD | Admitting: Family Medicine

## 2023-11-05 ENCOUNTER — Ambulatory Visit (INDEPENDENT_AMBULATORY_CARE_PROVIDER_SITE_OTHER): Payer: BC Managed Care – PPO | Admitting: Physician Assistant

## 2023-11-05 ENCOUNTER — Encounter: Payer: Self-pay | Admitting: Physician Assistant

## 2023-11-05 VITALS — BP 118/78 | HR 79 | Temp 98.0°F | Ht 64.0 in | Wt 170.0 lb

## 2023-11-05 DIAGNOSIS — F5105 Insomnia due to other mental disorder: Secondary | ICD-10-CM | POA: Diagnosis not present

## 2023-11-05 DIAGNOSIS — F419 Anxiety disorder, unspecified: Secondary | ICD-10-CM

## 2023-11-05 DIAGNOSIS — F320 Major depressive disorder, single episode, mild: Secondary | ICD-10-CM

## 2023-11-05 DIAGNOSIS — K219 Gastro-esophageal reflux disease without esophagitis: Secondary | ICD-10-CM | POA: Diagnosis not present

## 2023-11-05 DIAGNOSIS — I10 Essential (primary) hypertension: Secondary | ICD-10-CM

## 2023-11-05 MED ORDER — ALPRAZOLAM 0.25 MG PO TABS: 0.2500 mg | ORAL_TABLET | Freq: Every evening | ORAL | 0 refills | Status: DC | PRN

## 2023-11-05 NOTE — Assessment & Plan Note (Signed)
 118/78 Controlled. Continue current medications. No change in management. Continue dietary efforts and physical activity.

## 2023-11-05 NOTE — Progress Notes (Signed)
 Established Patient Office Visit  Subjective   Patient ID: Melanie Clark, female    DOB: 1976-05-31  Age: 48 y.o. MRN: [id number]  Chief Complaint  Patient presents with   Follow-up   Anxiety    Would like xanax  for anxiety and sleep    Patient presents today for 6 month follow up regarding hypertension, depression, and GERD. Patient states daily compliance with medications and voices no concerns for adverse effects. Patient reports increased depression and anxiety recently following a relationship breakup. She states his has causes decreased sleep, reporting less than 2 hours of sleep last night. She is requesting medication to aid her with sleep and decrease anxiety.      Review of Systems  Constitutional:  Negative for chills, fever and weight loss.  Respiratory:  Negative for cough and shortness of breath.   Cardiovascular:  Negative for chest pain and palpitations.  Gastrointestinal:  Negative for heartburn, nausea and vomiting.  Psychiatric/Behavioral:  Positive for depression. The patient is nervous/anxious.       Objective:     BP 118/78   Pulse 79   Temp 98 F (36.7 C)   Ht 5' 4 (1.626 m)   Wt 170 lb (77.1 kg)   LMP 12/23/2012   SpO2 99%   BMI 29.18 kg/m    Physical Exam Constitutional:      Appearance: Normal appearance.  HENT:     Head: Normocephalic.     Mouth/Throat:     Mouth: Mucous membranes are moist.     Pharynx: Oropharynx is clear.  Eyes:     Extraocular Movements: Extraocular movements intact.     Conjunctiva/sclera: Conjunctivae normal.  Cardiovascular:     Rate and Rhythm: Regular rhythm.     Heart sounds: No murmur heard.    No gallop.  Pulmonary:     Effort: Pulmonary effort is normal.     Breath sounds: No wheezing, rhonchi or rales.  Musculoskeletal:     Right lower leg: No edema.     Left lower leg: No edema.  Skin:    General: Skin is warm and dry.  Neurological:     General: No focal deficit present.     Mental  Status: She is alert and oriented to person, place, and time.  Psychiatric:        Mood and Affect: Mood normal.        Behavior: Behavior normal.      No results found for any visits on 11/05/23.  The 10-year ASCVD risk score (Arnett DK, et al., 2019) is: 1.3%    Assessment & Plan:   Return in about 6 months (around 05/04/2024).   Essential hypertension, benign Assessment & Plan: 118/78 Controlled. Continue current medications. No change in management. Continue dietary efforts and physical activity.    Depression, major, single episode, mild (HCC) Assessment & Plan: Stable.  Continue current treatment plan.    Gastroesophageal reflux disease without esophagitis Assessment & Plan: Stable. Continue with current treatment plan.    Hyposomnia, insomnia or sleeplessness associated with anxiety Assessment & Plan: Patient with increased anxiety, leading to decreased sleep, in the last few weeks due to relationship breakup.  0.25mg  xanax  for sleep. Patient counseled on infrequent use of medication, sedative nature of drug, avoidance of other sedating drugs, and abuse potential with this medication. Patient voices understanding and verbalizes plan for infrequent use.  PDMP reviewed.  Orders: -     ALPRAZolam ; Take 1 tablet (0.25 mg total) by  mouth at bedtime as needed for anxiety.  Dispense: 20 tablet; Refill: 0    Jacoba Cherney, PA-C

## 2023-11-05 NOTE — Assessment & Plan Note (Addendum)
 Stable. Continue current treatment plan.

## 2023-11-05 NOTE — Assessment & Plan Note (Signed)
 Patient with increased anxiety, leading to decreased sleep, in the last few weeks due to relationship breakup.  0.25mg  xanax  for sleep. Patient counseled on infrequent use of medication, sedative nature of drug, avoidance of other sedating drugs, and abuse potential with this medication. Patient voices understanding and verbalizes plan for infrequent use.  PDMP reviewed.

## 2023-11-05 NOTE — Assessment & Plan Note (Signed)
Stable. Continue with current treatment plan.

## 2023-11-27 ENCOUNTER — Encounter (HOSPITAL_COMMUNITY): Payer: Self-pay

## 2023-11-27 ENCOUNTER — Other Ambulatory Visit: Payer: Self-pay

## 2023-11-27 ENCOUNTER — Encounter (HOSPITAL_COMMUNITY)
Admission: RE | Admit: 2023-11-27 | Discharge: 2023-11-27 | Disposition: A | Discharge status: Home / Self Care | Payer: BC Managed Care – PPO | Source: Ambulatory Visit | Attending: Internal Medicine | Admitting: Internal Medicine

## 2023-11-27 HISTORY — DX: Anxiety disorder, unspecified: F41.9

## 2023-12-01 ENCOUNTER — Ambulatory Visit (HOSPITAL_COMMUNITY): Admitting: Certified Registered Nurse Anesthetist

## 2023-12-01 ENCOUNTER — Ambulatory Visit: Payer: Self-pay | Admitting: Family Medicine

## 2023-12-01 ENCOUNTER — Ambulatory Visit: Admitting: Family Medicine

## 2023-12-01 ENCOUNTER — Encounter (HOSPITAL_COMMUNITY)
Admission: RE | Disposition: A | Discharge status: Home / Self Care | Payer: Self-pay | Source: Ambulatory Visit | Attending: Internal Medicine

## 2023-12-01 ENCOUNTER — Ambulatory Visit (HOSPITAL_COMMUNITY)
Admission: RE | Admit: 2023-12-01 | Discharge: 2023-12-01 | Disposition: A | Discharge status: Home / Self Care | Payer: BC Managed Care – PPO | Source: Ambulatory Visit | Attending: Internal Medicine | Admitting: Internal Medicine

## 2023-12-01 ENCOUNTER — Encounter (HOSPITAL_COMMUNITY): Payer: Self-pay | Admitting: Internal Medicine

## 2023-12-01 VITALS — BP 126/84 | HR 80 | Wt 174.0 lb

## 2023-12-01 DIAGNOSIS — K219 Gastro-esophageal reflux disease without esophagitis: Secondary | ICD-10-CM | POA: Diagnosis not present

## 2023-12-01 DIAGNOSIS — K621 Rectal polyp: Secondary | ICD-10-CM

## 2023-12-01 DIAGNOSIS — K648 Other hemorrhoids: Secondary | ICD-10-CM | POA: Insufficient documentation

## 2023-12-01 DIAGNOSIS — I10 Essential (primary) hypertension: Secondary | ICD-10-CM | POA: Insufficient documentation

## 2023-12-01 DIAGNOSIS — K6289 Other specified diseases of anus and rectum: Secondary | ICD-10-CM

## 2023-12-01 DIAGNOSIS — D128 Benign neoplasm of rectum: Secondary | ICD-10-CM | POA: Insufficient documentation

## 2023-12-01 DIAGNOSIS — J111 Influenza due to unidentified influenza virus with other respiratory manifestations: Secondary | ICD-10-CM | POA: Diagnosis not present

## 2023-12-01 DIAGNOSIS — K635 Polyp of colon: Secondary | ICD-10-CM

## 2023-12-01 DIAGNOSIS — D122 Benign neoplasm of ascending colon: Secondary | ICD-10-CM | POA: Insufficient documentation

## 2023-12-01 DIAGNOSIS — Z1211 Encounter for screening for malignant neoplasm of colon: Secondary | ICD-10-CM | POA: Diagnosis present

## 2023-12-01 HISTORY — PX: POLYPECTOMY: SHX5525

## 2023-12-01 HISTORY — PX: COLONOSCOPY WITH PROPOFOL: SHX5780

## 2023-12-01 SURGERY — COLONOSCOPY WITH PROPOFOL
Anesthesia: General

## 2023-12-01 MED ORDER — LACTATED RINGERS IV SOLN
INTRAVENOUS | Status: DC | PRN
Start: 2023-12-01 — End: 2023-12-01

## 2023-12-01 MED ORDER — HYDROCODONE BIT-HOMATROP MBR 5-1.5 MG/5ML PO SOLN: 5.0000 mL | Freq: Three times a day (TID) | ORAL | 0 refills | Status: DC | PRN

## 2023-12-01 MED ORDER — PROPOFOL 500 MG/50ML IV EMUL
INTRAVENOUS | Status: DC | PRN
  Administered 2023-12-01: 100 mg via INTRAVENOUS
  Administered 2023-12-01: 150 ug/kg/min via INTRAVENOUS

## 2023-12-01 NOTE — Transfer of Care (Signed)
 Immediate Anesthesia Transfer of Care Note  Patient: Melanie Clark  Procedure(s) Performed: COLONOSCOPY WITH PROPOFOL POLYPECTOMY  Patient Location: Short Stay  Anesthesia Type:General  Level of Consciousness: drowsy  Airway & Oxygen Therapy: Patient Spontanous Breathing  Post-op Assessment: Report given to RN and Post -op Vital signs reviewed and stable  Post vital signs: Reviewed and stable  Last Vitals:  Vitals Value Taken Time  BP 108/60 12/01/23 0819  Temp 36.9 C 12/01/23 0819  Pulse 84 12/01/23 0819  Resp 20 12/01/23 0819  SpO2 100 % 12/01/23 0819    Last Pain:  Vitals:   12/01/23 0819  TempSrc: Axillary  PainSc: 0-No pain         Complications: No notable events documented.

## 2023-12-01 NOTE — Patient Instructions (Signed)
Medications as prescribed.  Rest. Fluids.

## 2023-12-01 NOTE — Discharge Instructions (Signed)
  Colonoscopy Discharge Instructions  Read the instructions outlined below and refer to this sheet in the next few weeks. These discharge instructions provide you with general information on caring for yourself after you leave the hospital. Your doctor may also give you specific instructions. While your treatment has been planned according to the most current medical practices available, unavoidable complications occasionally occur.   ACTIVITY You may resume your regular activity, but move at a slower pace for the next 24 hours.  Take frequent rest periods for the next 24 hours.  Walking will help get rid of the air and reduce the bloated feeling in your belly (abdomen).  No driving for 24 hours (because of the medicine (anesthesia) used during the test).   Do not sign any important legal documents or operate any machinery for 24 hours (because of the anesthesia used during the test).  NUTRITION Drink plenty of fluids.  You may resume your normal diet as instructed by your doctor.  Begin with a light meal and progress to your normal diet. Heavy or fried foods are harder to digest and may make you feel sick to your stomach (nauseated).  Avoid alcoholic beverages for 24 hours or as instructed.  MEDICATIONS You may resume your normal medications unless your doctor tells you otherwise.  WHAT YOU CAN EXPECT TODAY Some feelings of bloating in the abdomen.  Passage of more gas than usual.  Spotting of blood in your stool or on the toilet paper.  IF YOU HAD POLYPS REMOVED DURING THE COLONOSCOPY: No aspirin products for 7 days or as instructed.  No alcohol for 7 days or as instructed.  Eat a soft diet for the next 24 hours.  FINDING OUT THE RESULTS OF YOUR TEST Not all test results are available during your visit. If your test results are not back during the visit, make an appointment with your caregiver to find out the results. Do not assume everything is normal if you have not heard from your  caregiver or the medical facility. It is important for you to follow up on all of your test results.  SEEK IMMEDIATE MEDICAL ATTENTION IF: You have more than a spotting of blood in your stool.  Your belly is swollen (abdominal distention).  You are nauseated or vomiting.  You have a temperature over 101.  You have abdominal pain or discomfort that is severe or gets worse throughout the day.   Your colonoscopy revealed 2 polyp(s) which I removed successfully. Await pathology results, my office will contact you. I recommend repeating colonoscopy in 7 years for surveillance purposes.   Otherwise follow up with GI as needed,   I hope you have a great rest of your week!  Hennie Duos. Marletta Lor, D.O. Gastroenterology and Hepatology [facility]

## 2023-12-01 NOTE — Telephone Encounter (Signed)
 Answer Assessment - Initial Assessment Questions 1. REASON FOR CALL or QUESTION: "What is your reason for calling today?" or "How can I best help you?" or "What question do you have that I can help answer?"     Patient was seen in office today but did not get Tamiflu called in as expected. Please advise.  Protocols used: Information Only Call - No Triage-A-AH

## 2023-12-01 NOTE — Op Note (Signed)
 [facility] Patient Name: Melanie Clark Procedure Date: 12/01/2023 7:47 AM MRN: [id number] Date of Birth: Aug 23, 1976 Attending MD: Hennie Duos. Marletta Lor , [place], [id number] CSN: [id number] Age: 48 Admit Type: Inpatient Procedure:                Colonoscopy Indications:              Screening for colorectal malignant neoplasm Providers:                Hennie Duos. Marletta Lor, DO, Emilee Tubb RN, RN, Lafonda Mosses, Technician Referring MD:              Medicines:                See the Anesthesia note for documentation of the                            administered medications Complications:            No immediate complications. Estimated Blood Loss:     Estimated blood loss was minimal. Procedure:                Pre-Anesthesia Assessment:                           - The anesthesia plan was to use monitored                            anesthesia care (MAC).                           After obtaining informed consent, the colonoscope                            was passed under direct vision. Throughout the                            procedure, the patient's blood pressure, pulse, and                            oxygen saturations were monitored continuously. The                            [id number]-HQ190L ([id number]) scope was introduced through                            the anus and advanced to the the terminal ileum,                            with identification of the appendiceal orifice and                            IC valve. The colonoscopy was performed without                            difficulty.  The patient tolerated the procedure                            well. The quality of the bowel preparation was                            evaluated using the BBPS [facility] Bowel Preparation                            Scale) with scores of: Right Colon = 3, Transverse                            Colon = 3 and Left Colon = 3 (entire mucosa seen                             well with no residual staining, small fragments of                            stool or opaque liquid). The total BBPS score                            equals 9. Scope In: 8:00:16 AM Scope Out: 8:16:24 AM Scope Withdrawal Time: 0 hours 13 minutes 18 seconds  Total Procedure Duration: 0 hours 16 minutes 8 seconds  Findings:      Non-bleeding internal hemorrhoids were found during retroflexion.      Hypertrophied anal papillae found on retroflection in rectum      An 8 mm polyp was found in the ascending colon. The polyp was sessile.       The polyp was removed with a cold snare. Resection and retrieval were       complete.      A 4 mm polyp was found in the rectum. The polyp was sessile. The polyp       was removed with a cold snare. Resection and retrieval were complete.      The exam was otherwise without abnormality. Impression:               - Non-bleeding internal hemorrhoids.                           - One 8 mm polyp in the ascending colon, removed                            with a cold snare. Resected and retrieved.                           - One 4 mm polyp in the rectum, removed with a cold                            snare. Resected and retrieved.                           - The examination was otherwise normal. Moderate Sedation:      Per Anesthesia Care Recommendation:           -  Patient has a contact number available for                            emergencies. The signs and symptoms of potential                            delayed complications were discussed with the                            patient. Return to normal activities tomorrow.                            Written discharge instructions were provided to the                            patient.                           - Resume previous diet.                           - Continue present medications.                           - Await pathology results.                           - Repeat colonoscopy in 7 years for  surveillance.                           - Return to GI clinic PRN. Procedure Code(s):        --- Professional ---                           [phone], Colonoscopy, flexible; with removal of                            tumor(s), polyp(s), or other lesion(s) by snare                            technique Diagnosis Code(s):        --- Professional ---                           Z12.11, Encounter for screening for malignant                            neoplasm of colon                           D12.2, Benign neoplasm of ascending colon                           D12.8, Benign neoplasm of rectum                           K64.8, Other hemorrhoids CPT copyright  2022 American Medical Association. All rights reserved. The codes documented in this report are preliminary and upon coder review may  be revised to meet current compliance requirements. Hennie Duos. Marletta Lor, DO Hennie Duos. Melanie Civil, DO 12/01/2023 8:20:09 AM This report has been signed electronically. Number of Addenda: 0

## 2023-12-01 NOTE — Progress Notes (Signed)
 Subjective:  Patient ID: Melanie Clark, female    DOB: 1976/07/03  Age: 48 y.o. MRN: [id number]  CC:   Chief Complaint  Patient presents with   Cough     Dry hacking cough for 3 days, fatigue, congested, burning throat and chest  Taking alka seltzer plus, delsyum, advil    HPI:  48 year old female presents with respiratory symptoms.  Started on Saturday. She reports recent exposure to flu.  Reports cough, fatigue and chills. Has been taking OTC meds without improvement. No current fever. No known exacerbating factors. Needs work note.  Patient Active Problem List   Diagnosis Date Noted   Influenza-like illness 12/01/2023   Hyposomnia, insomnia or sleeplessness associated with anxiety 11/05/2023   GERD (gastroesophageal reflux disease) 12/06/2021   Depression, major, single episode, mild (HCC) 11/17/2017   Essential hypertension, benign 01/06/2013    Social Hx   Social History   Socioeconomic History   Marital status: Divorced    Spouse name: Not on file   Number of children: Not on file   Years of education: Not on file   Highest education level: Bachelor's degree (e.g., BA, AB, BS)  Occupational History   Not on file  Tobacco Use   Smoking status: Never   Smokeless tobacco: Never  Vaping Use   Vaping status: Never Used  Substance and Sexual Activity   Alcohol use: Yes    Comment: social   Drug use: No   Sexual activity: Not on file  Other Topics Concern   Not on file  Social History Narrative   Not on file   Social Drivers of Health   Financial Resource Strain: Low Risk  (12/01/2023)   Overall Financial Resource Strain (CARDIA)    Difficulty of Paying Living Expenses: Not hard at all  Food Insecurity: No Food Insecurity (12/01/2023)   Hunger Vital Sign    Worried About Running Out of Food in the Last Year: Never true    Ran Out of Food in the Last Year: Never true  Transportation Needs: No Transportation Needs (12/01/2023)   PRAPARE - Therapist, art (Medical): No    Lack of Transportation (Non-Medical): No  Physical Activity: Insufficiently Active (12/01/2023)   Exercise Vital Sign    Days of Exercise per Week: 3 days    Minutes of Exercise per Session: 40 min  Stress: No Stress Concern Present (12/01/2023)   [employer] of Occupational Health - Occupational Stress Questionnaire    Feeling of Stress : Not at all  Social Connections: Moderately Integrated (12/01/2023)   Social Connection and Isolation Panel [NHANES]    Frequency of Communication with Friends and Family: More than three times a week    Frequency of Social Gatherings with Friends and Family: Three times a week    Attends Religious Services: More than 4 times per year    Active Member of Clubs or Organizations: Yes    Attends Banker Meetings: More than 4 times per year    Marital Status: Divorced    Review of Systems Per HPI  Objective:  BP 126/84   Pulse 80   Wt 174 lb (78.9 kg)   LMP 12/23/2012   SpO2 96%   BMI 29.87 kg/m      12/01/2023    9:01 PM 12/01/2023    8:19 AM 12/01/2023    7:50 AM  BP/Weight  Systolic BP 126 108 123  Diastolic BP 84 60 80  Wt. (Lbs) 174    BMI 29.87 kg/m2      Physical Exam Constitutional:      General: She is not in acute distress.    Appearance: Normal appearance.  HENT:     Head: Normocephalic and atraumatic.  Eyes:     General:        Right eye: No discharge.        Left eye: No discharge.     Conjunctiva/sclera: Conjunctivae normal.  Cardiovascular:     Rate and Rhythm: Normal rate and regular rhythm.  Pulmonary:     Effort: Pulmonary effort is normal. No respiratory distress.     Breath sounds: No wheezing or rales.  Neurological:     Mental Status: She is alert.     Lab Results  Component Value Date   WBC 10.0 09/16/2023   HGB 13.6 09/16/2023   HCT 39.4 09/16/2023   PLT 544 (H) 09/16/2023   GLUCOSE 119 (H) 09/16/2023   CHOL 209 (H) 07/08/2023   TRIG 113  07/08/2023   HDL 59 07/08/2023   LDLCALC 130 (H) 07/08/2023   ALT 25 09/16/2023   AST 18 09/16/2023   NA 131 (L) 09/16/2023   K 4.5 09/16/2023   CL 94 (L) 09/16/2023   CREATININE 0.67 09/16/2023   BUN 12 09/16/2023   CO2 28 09/16/2023   TSH 3.080 10/26/2019     Assessment & Plan:  Influenza-like illness Assessment & Plan: Empiric treatment with tamiflu. Cough medication as directed. Work note given.   Other orders -     HYDROcodone Bit-Homatrop MBr; Take 5 mLs by mouth every 8 (eight) hours as needed for cough.  Dispense: 120 mL; Refill: 0 -     Oseltamivir Phosphate; Take 1 capsule (75 mg total) by mouth 2 (two) times daily.  Dispense: 10 capsule; Refill: 0    Follow-up:  Return if symptoms worsen or fail to improve.  Everlene Other DO [facility]

## 2023-12-01 NOTE — Assessment & Plan Note (Signed)
 Empiric treatment with tamiflu. Cough medication as directed. Work note given.

## 2023-12-01 NOTE — Telephone Encounter (Signed)
 Chief Complaint: Medication not at pharmacy Disposition: [] ED /[] Urgent Care (no appt availability in office) / [] Appointment(In office/virtual)/ []  [facility]/ [] Home Care/ [] Refused Recommended Disposition /[] [place] [facility]/ [x]  Follow-up with PCP Additional Notes: Pt called because she was seen in office today by Dr. Adriana Simas. Pt states Dr. Adriana Simas said he would call in Tamiflu and cough syrup to her pharmacy. Pt states the Tamiflu was not at pharmacy. This RN transferred care to team lead RN who is contacting on call for prescription.    Copied from CRM [id number]. Topic: Clinical - Pink Word Triage >> Dec 01, 2023  5:56 PM Priscille Loveless wrote: Reason for Triage:Pt Melanie Clark to the dr today and she only got cough meds but did not get tamiflu. Please advise. Reason for Disposition  Health Information question, no triage required and triager able to answer question  Protocols used: Information Only Call - No Triage-A-AH

## 2023-12-01 NOTE — H&P (Signed)
 Primary Care Physician:  Tommie Sams, DO Primary Gastroenterologist:  Dr. Marletta Lor  Pre-Procedure History & Physical: HPI:  Melanie Clark is a 48 y.o. female is here for a colonoscopy for colon cancer screening purposes.  Patient denies any family history of colorectal cancer.  No melena or hematochezia.  No abdominal pain or unintentional weight loss.  No change in bowel habits.  Overall feels well from a GI standpoint.  Past Medical History:  Diagnosis Date   Anxiety    GERD (gastroesophageal reflux disease)    Hypertension     Past Surgical History:  Procedure Laterality Date   BREAST REDUCTION SURGERY     CHOLECYSTECTOMY N/A 09/13/2023   Procedure: LAPAROSCOPIC CHOLECYSTECTOMY WITH ICG;  Surgeon: Emelia Loron, MD;  Location: [facility];  Service: General;  Laterality: N/A;   URETHERAL RE-IMPLANTATION     sling    Prior to Admission medications   Medication Sig Start Date End Date Taking? Authorizing Provider  atenolol (TENORMIN) 100 MG tablet Take 1 tablet (100 mg total) by mouth daily. 05/05/23  Yes Cook, Jayce G, DO  buPROPion (WELLBUTRIN SR) 150 MG 12 hr tablet Take 1 tablet (150 mg total) by mouth 2 (two) times daily. 05/05/23  Yes Cook, Jayce G, DO  enalapril (VASOTEC) 20 MG tablet Take 1 tablet (20 mg total) by mouth daily. 05/05/23  Yes Cook, Jayce G, DO  Na Sulfate-K Sulfate-Mg Sulfate concentrate 17.5-3.13-1.6 GM/177ML SOLN Take 1 kit by mouth as directed. 11/03/23  Yes Faten Frieson K, DO  pantoprazole (PROTONIX) 40 MG tablet TAKE 1 TABLET BY MOUTH EVERY DAY 10/02/23  Yes Cook, Jayce G, DO  ALPRAZolam (XANAX) 0.25 MG tablet Take 1 tablet (0.25 mg total) by mouth at bedtime as needed for anxiety. 11/05/23   Grooms, [place], PA-C    Allergies as of 11/03/2023   (No Known Allergies)    Family History  Problem Relation Age of Onset   Hypertension Mother    Hypertension Father    Diabetes Other    Hypertension Other    Cancer Other    Hypertension Other     Hypertension Paternal Grandmother    Hypertension Paternal Grandfather     Social History   Socioeconomic History   Marital status: Divorced    Spouse name: Not on file   Number of children: Not on file   Years of education: Not on file   Highest education level: Not on file  Occupational History   Not on file  Tobacco Use   Smoking status: Never   Smokeless tobacco: Never  Vaping Use   Vaping status: Never Used  Substance and Sexual Activity   Alcohol use: Yes    Comment: social   Drug use: No   Sexual activity: Not on file  Other Topics Concern   Not on file  Social History Narrative   Not on file   Social Drivers of Health   Financial Resource Strain: Not on file  Food Insecurity: Not on file  Transportation Needs: Not on file  Physical Activity: Not on file  Stress: Not on file  Social Connections: Not on file  Intimate Partner Violence: Not on file    Review of Systems: See HPI, otherwise negative ROS  Physical Exam: Vital signs in last 24 hours:     General:   Alert,  Well-developed, well-nourished, pleasant and cooperative in NAD Head:  Normocephalic and atraumatic. Eyes:  Sclera clear, no icterus.   Conjunctiva pink. Ears:  Normal auditory  acuity. Nose:  No deformity, discharge,  or lesions. Msk:  Symmetrical without gross deformities. Normal posture. Extremities:  Without clubbing or edema. Neurologic:  Alert and  oriented x4;  grossly normal neurologically. Skin:  Intact without significant lesions or rashes. Psych:  Alert and cooperative. Normal mood and affect.  Impression/Plan: Melanie Clark is here for a colonoscopy to be performed for colon cancer screening purposes.  The risks of the procedure including infection, bleed, or perforation as well as benefits, limitations, alternatives and imponderables have been reviewed with the patient. Questions have been answered. All parties agreeable.

## 2023-12-01 NOTE — Anesthesia Preprocedure Evaluation (Signed)
 Anesthesia Evaluation  Patient identified by MRN, date of birth, ID band Patient awake    Reviewed: Allergy & Precautions, H&P , NPO status , Patient's Chart, lab work & pertinent test results, reviewed documented beta blocker date and time   Airway Mallampati: II  TM Distance: >3 FB Neck ROM: full    Dental no notable dental hx.    Pulmonary neg pulmonary ROS   Pulmonary exam normal breath sounds clear to auscultation       Cardiovascular Exercise Tolerance: Good hypertension, negative cardio ROS  Rhythm:regular Rate:Normal     Neuro/Psych  PSYCHIATRIC DISORDERS Anxiety Depression    negative neurological ROS  negative psych ROS   GI/Hepatic negative GI ROS, Neg liver ROS,GERD  ,,  Endo/Other  negative endocrine ROS    Renal/GU negative Renal ROS  negative genitourinary   Musculoskeletal   Abdominal   Peds  Hematology negative hematology ROS (+)   Anesthesia Other Findings   Reproductive/Obstetrics negative OB ROS                             Anesthesia Physical Anesthesia Plan  ASA: 2  Anesthesia Plan: General   Post-op Pain Management:    Induction:   PONV Risk Score and Plan: Propofol infusion  Airway Management Planned:   Additional Equipment:   Intra-op Plan:   Post-operative Plan:   Informed Consent: I have reviewed the patients History and Physical, chart, labs and discussed the procedure including the risks, benefits and alternatives for the proposed anesthesia with the patient or authorized representative who has indicated his/her understanding and acceptance.     Dental Advisory Given  Plan Discussed with: CRNA  Anesthesia Plan Comments:        Anesthesia Quick Evaluation

## 2023-12-02 ENCOUNTER — Other Ambulatory Visit: Payer: Self-pay | Admitting: Family Medicine

## 2023-12-02 ENCOUNTER — Encounter (HOSPITAL_COMMUNITY): Payer: Self-pay | Admitting: Internal Medicine

## 2023-12-02 LAB — SURGICAL PATHOLOGY

## 2023-12-02 MED ORDER — OSELTAMIVIR PHOSPHATE 75 MG PO CAPS: 75.0000 mg | ORAL_CAPSULE | Freq: Two times a day (BID) | ORAL | 0 refills | Status: DC

## 2023-12-02 NOTE — Anesthesia Postprocedure Evaluation (Signed)
 Anesthesia Post Note  Patient: Melanie Clark  Procedure(s) Performed: COLONOSCOPY WITH PROPOFOL POLYPECTOMY  Patient location during evaluation: Phase II Anesthesia Type: General Level of consciousness: awake Pain management: pain level controlled Vital Signs Assessment: post-procedure vital signs reviewed and stable Respiratory status: spontaneous breathing and respiratory function stable Cardiovascular status: blood pressure returned to baseline and stable Postop Assessment: no headache and no apparent nausea or vomiting Anesthetic complications: no Comments: Late entry   No notable events documented.   Last Vitals:  Vitals:   12/01/23 0750 12/01/23 0819  BP: 123/80 108/60  Pulse: 85 84  Resp: 18 20  Temp: 36.9 C 36.9 C  SpO2: 100% 100%    Last Pain:  Vitals:   12/01/23 0819  TempSrc: Axillary  PainSc: 0-No pain                 Windell Norfolk

## 2023-12-02 NOTE — Telephone Encounter (Signed)
 Medication sent.

## 2023-12-02 NOTE — Addendum Note (Signed)
 Addended by: Tommie Sams on: 12/02/2023 08:17 AM   Modules accepted: Orders

## 2024-01-07 ENCOUNTER — Ambulatory Visit (INDEPENDENT_AMBULATORY_CARE_PROVIDER_SITE_OTHER): Payer: BLUE CROSS/BLUE SHIELD | Admitting: Otolaryngology

## 2024-01-07 ENCOUNTER — Encounter (INDEPENDENT_AMBULATORY_CARE_PROVIDER_SITE_OTHER): Payer: Self-pay

## 2024-01-07 VITALS — BP 154/90 | HR 62 | Ht 64.0 in | Wt 167.0 lb

## 2024-01-07 DIAGNOSIS — H903 Sensorineural hearing loss, bilateral: Secondary | ICD-10-CM | POA: Diagnosis not present

## 2024-01-10 DIAGNOSIS — H903 Sensorineural hearing loss, bilateral: Secondary | ICD-10-CM | POA: Insufficient documentation

## 2024-01-10 NOTE — Progress Notes (Signed)
 Patient ID: Melanie Clark, female   DOB: [birth year], 48 y.o.   MRN: [id number]  Follow-up: Progressive hearing loss  HPI: The patient is a 48 year old female who returns today for her follow-up evaluation.  She was last seen 1 year ago.  At that time, she was complaining of bilateral progressive hearing loss.  She was noted to have bilateral high-frequency sensorineural hearing loss.  She was fitted with bilateral hearing aids.  The patient returns today complaining of progressive worsening of her hearing.  She denies any otalgia, otorrhea, or vertigo.  She also denies any significant tinnitus.  She has no recent otitis media or otitis externa.  Exam: General: Communicates without difficulty, well nourished, no acute distress. Head: Normocephalic, no evidence injury, no tenderness, facial buttresses intact without stepoff. Face/sinus: No tenderness to palpation and percussion. Facial movement is normal and symmetric. Eyes: PERRL, EOMI. No scleral icterus, conjunctivae clear. Neuro: CN II exam reveals vision grossly intact.  No nystagmus at any point of gaze. Ears: Auricles well formed without lesions.  Ear canals are intact without mass or lesion.  No erythema or edema is appreciated.  The TMs are intact without fluid. Nose: External evaluation reveals normal support and skin without lesions.  Dorsum is intact.  Anterior rhinoscopy reveals normal mucosa over anterior aspect of inferior turbinates and intact septum.  No purulence noted. Oral:  Oral cavity and oropharynx are intact, symmetric, without erythema or edema.  Mucosa is moist without lesions. Neck: Full range of motion without pain.  There is no significant lymphadenopathy.  No masses palpable.  Thyroid bed within normal limits to palpation.  Parotid glands and submandibular glands equal bilaterally without mass.  Trachea is midline. Neuro:  CN 2-12 grossly intact.   Assessment: 1.  Bilateral high-frequency sensorineural hearing loss.  Her  hearing has subjectively worsened over the year. 2.  Her ear canals, tympanic membranes, and middle ear spaces are normal.  No middle ear effusion is noted.  Plan: 1.  The physical exam findings are reviewed with the patient. 2.  Continue the use of her hearing aids. 3.  Outpatient hearing test to evaluate her hearing level.  No audiologist is available today. 4.  The patient will return for reevaluation in 1 year.

## 2024-01-13 ENCOUNTER — Ambulatory Visit (INDEPENDENT_AMBULATORY_CARE_PROVIDER_SITE_OTHER): Admitting: Audiology

## 2024-01-13 DIAGNOSIS — H903 Sensorineural hearing loss, bilateral: Secondary | ICD-10-CM

## 2024-01-13 NOTE — Progress Notes (Signed)
  [address], [address] [place], [place] [address] [phone]  Audiological Evaluation    Name: Melanie Clark     DOB:   12-14-1975      MRN:   [id number]                                                                                     Service Date: 01/13/2024     Accompanied by: unaccompanied    Patient comes today after Dr. Darlin Ehrlich, ENT sent a referral for a hearing evaluation due to concerns with hearing loss decline.   Symptoms Yes Details  Hearing loss  [x]  Thinks struggles to understand others  Tinnitus  []    Ear pain/ infections/pressure  []    Balance problems  []    Noise exposure history  [x]  Shooting, mowing grass- now wears hearing protection  Previous ear surgeries  []    Family history of hearing loss  [x]  Father - but also was a veteran  Amplification  [x]  bilateral hearing aids - [facility]  Other  []      Otoscopy: Right ear: Clear external ear canals and notable landmarks visualized on the tympanic membrane. Left ear:  Clear external ear canals and notable landmarks visualized on the tympanic membrane.  Tympanometry: Right ear: Type A- Normal external ear canal volume with normal middle ear pressure and tympanic membrane compliance Left ear: Type A- Normal external ear canal volume with normal middle ear pressure and tympanic membrane compliance  Pure tone Audiometry: Both ears- Normal hearing from [phone] Hz, then mild to severe sensorineural hearing loss from 3000 Hz - 8000 Hz.   Speech Audiometry: Right ear- Speech Reception Threshold (SRT) was obtained at 20 dBHL. Left ear-Speech Reception Threshold (SRT) was obtained at 20 dBHL.   Word Recognition Score Tested using NU-6 (MLV) Right ear: 84% was obtained at a presentation level of 70 dBHL with contralateral masking which is deemed as  good . Left ear: 88% was obtained at a presentation level of 70 dBHL with contralateral masking which is deemed as  good .   The hearing test  results were completed under inserts and results are deemed to be of good reliability. Test technique:  conventional    Impression: No significant decline noted when compare dt o her previous audiogram in 2024 at [facility].  Recommendations: Follow up with ENT as scheduled for today. Return for a hearing evaluation if concerns with hearing changes arise or per MD recommendation. Recommend real ear measurements to verify settings of the aids are meeting prescription targets. Patient aware we do not have the equipment at the clinic.   Of note, additional domes and filters provided. Connected her aids to her phone.   [name], AUD

## 2024-02-20 ENCOUNTER — Encounter: Payer: Self-pay | Admitting: Family Medicine

## 2024-02-20 ENCOUNTER — Other Ambulatory Visit: Payer: Self-pay | Admitting: Family Medicine

## 2024-02-20 MED ORDER — KETOCONAZOLE 2 % EX CREA: 1.0000 | TOPICAL_CREAM | Freq: Every day | CUTANEOUS | 3 refills | Status: DC

## 2024-02-24 ENCOUNTER — Other Ambulatory Visit: Payer: Self-pay | Admitting: Family Medicine

## 2024-02-24 MED ORDER — KETOCONAZOLE 2 % EX CREA: 1.0000 | TOPICAL_CREAM | Freq: Every day | CUTANEOUS | 3 refills | Status: DC

## 2024-04-06 ENCOUNTER — Other Ambulatory Visit: Payer: Self-pay | Admitting: Family Medicine

## 2024-05-04 ENCOUNTER — Ambulatory Visit (INDEPENDENT_AMBULATORY_CARE_PROVIDER_SITE_OTHER): Payer: BC Managed Care – PPO | Admitting: Family Medicine

## 2024-05-04 ENCOUNTER — Encounter: Payer: Self-pay | Admitting: Family Medicine

## 2024-05-04 VITALS — BP 123/82 | HR 65 | Temp 97.8°F | Ht 64.0 in | Wt 171.0 lb

## 2024-05-04 DIAGNOSIS — E7800 Pure hypercholesterolemia, unspecified: Secondary | ICD-10-CM | POA: Diagnosis not present

## 2024-05-04 DIAGNOSIS — I10 Essential (primary) hypertension: Secondary | ICD-10-CM | POA: Diagnosis not present

## 2024-05-04 DIAGNOSIS — F320 Major depressive disorder, single episode, mild: Secondary | ICD-10-CM | POA: Diagnosis not present

## 2024-05-04 DIAGNOSIS — Z130 Encounter for screening for diseases of the blood and blood-forming organs and certain disorders involving the immune mechanism: Secondary | ICD-10-CM

## 2024-05-04 DIAGNOSIS — K219 Gastro-esophageal reflux disease without esophagitis: Secondary | ICD-10-CM

## 2024-05-04 MED ORDER — BUPROPION HCL ER (SR) 150 MG PO TB12: 150.0000 mg | ORAL_TABLET | Freq: Two times a day (BID) | ORAL | 3 refills | Status: AC

## 2024-05-04 MED ORDER — ENALAPRIL MALEATE 20 MG PO TABS: 20.0000 mg | ORAL_TABLET | Freq: Every day | ORAL | 3 refills | Status: AC

## 2024-05-04 MED ORDER — ATENOLOL 100 MG PO TABS
100.0000 mg | ORAL_TABLET | Freq: Every day | ORAL | 3 refills | Status: AC
Start: 2024-05-04 — End: ?

## 2024-05-04 NOTE — Patient Instructions (Signed)
Labs at your convenience.  Follow up in 6 months.  Take care  Dr. Adriana Simas

## 2024-05-04 NOTE — Assessment & Plan Note (Addendum)
 Stable

## 2024-05-04 NOTE — Assessment & Plan Note (Signed)
 Stable.  Continue current medications.  Medications refilled.  Labs ordered.

## 2024-05-04 NOTE — Assessment & Plan Note (Signed)
 Stable.  Bupropion  refilled.

## 2024-05-04 NOTE — Assessment & Plan Note (Signed)
Lipid panel ordered to assess

## 2024-05-04 NOTE — Progress Notes (Signed)
 Subjective:  Patient ID: Melanie Clark, female    DOB: 1976/05/20  Age: 48 y.o. MRN: [id number]  CC:   Chief Complaint  Patient presents with   Follow-up    6 month f/u     HPI:  48 year old female presents for follow-up.  Hypertension is well-controlled on atenolol  and enalapril .  Patient reports that overall she is doing well.  She does note that she is having difficulty losing weight.  She is seeing a nutritionist and eating well and exercising and still having difficulty losing weight.  She does not want GLP-1 medication.  In regards to her preventative health care, she declines HIV and hepatitis C screening.  She states that cervical cancer screening and mammograms are up-to-date.  This is managed by her OB/GYN.  Need records.  Depression stable on bupropion .  GERD stable on Protonix .  Patient Active Problem List   Diagnosis Date Noted   Pure hypercholesterolemia 05/04/2024   Hyposomnia, insomnia or sleeplessness associated with anxiety 11/05/2023   GERD (gastroesophageal reflux disease) 12/06/2021   Depression, major, single episode, mild (HCC) 11/17/2017   Essential hypertension, benign 01/06/2013    Social Hx   Social History   Socioeconomic History   Marital status: Divorced    Spouse name: Not on file   Number of children: Not on file   Years of education: Not on file   Highest education level: Bachelor's degree (e.g., BA, AB, BS)  Occupational History   Not on file  Tobacco Use   Smoking status: Never   Smokeless tobacco: Never  Vaping Use   Vaping status: Never Used  Substance and Sexual Activity   Alcohol use: Yes    Comment: social   Drug use: No   Sexual activity: Not on file  Other Topics Concern   Not on file  Social History Narrative   Not on file   Social Drivers of Health   Financial Resource Strain: Low Risk  (12/01/2023)   Overall Financial Resource Strain (CARDIA)    Difficulty of Paying Living Expenses: Not hard at all   Food Insecurity: No Food Insecurity (12/01/2023)   Hunger Vital Sign    Worried About Running Out of Food in the Last Year: Never true    Ran Out of Food in the Last Year: Never true  Transportation Needs: No Transportation Needs (12/01/2023)   PRAPARE - Administrator, Civil Service (Medical): No    Lack of Transportation (Non-Medical): No  Physical Activity: Insufficiently Active (12/01/2023)   Exercise Vital Sign    Days of Exercise per Week: 3 days    Minutes of Exercise per Session: 40 min  Stress: No Stress Concern Present (12/01/2023)   [employer] of Occupational Health - Occupational Stress Questionnaire    Feeling of Stress : Not at all  Social Connections: Moderately Integrated (12/01/2023)   Social Connection and Isolation Panel    Frequency of Communication with Friends and Family: More than three times a week    Frequency of Social Gatherings with Friends and Family: Three times a week    Attends Religious Services: More than 4 times per year    Active Member of Clubs or Organizations: Yes    Attends Banker Meetings: More than 4 times per year    Marital Status: Divorced    Review of Systems Per HPI  Objective:  BP 123/82   Pulse 65   Temp 97.8 F (36.6 C)   Ht 5' 4 (  1.626 m)   Wt 171 lb (77.6 kg)   LMP 12/23/2012   SpO2 99%   BMI 29.35 kg/m      05/04/2024    8:32 AM 01/07/2024    4:51 PM 12/01/2023    9:01 PM  BP/Weight  Systolic BP 123 154 126  Diastolic BP 82 90 84  Wt. (Lbs) 171 167 174  BMI 29.35 kg/m2 28.67 kg/m2 29.87 kg/m2    Physical Exam Vitals and nursing note reviewed.  Constitutional:      General: She is not in acute distress.    Appearance: Normal appearance.  HENT:     Head: Normocephalic and atraumatic.  Eyes:     General:        Right eye: No discharge.        Left eye: No discharge.     Conjunctiva/sclera: Conjunctivae normal.  Cardiovascular:     Rate and Rhythm: Normal rate and regular rhythm.   Pulmonary:     Effort: Pulmonary effort is normal.     Breath sounds: Normal breath sounds. No wheezing, rhonchi or rales.  Neurological:     Mental Status: She is alert.  Psychiatric:        Mood and Affect: Mood normal.        Behavior: Behavior normal.     Lab Results  Component Value Date   WBC 10.0 09/16/2023   HGB 13.6 09/16/2023   HCT 39.4 09/16/2023   PLT 544 (H) 09/16/2023   GLUCOSE 119 (H) 09/16/2023   CHOL 209 (H) 07/08/2023   TRIG 113 07/08/2023   HDL 59 07/08/2023   LDLCALC 130 (H) 07/08/2023   ALT 25 09/16/2023   AST 18 09/16/2023   NA 131 (L) 09/16/2023   K 4.5 09/16/2023   CL 94 (L) 09/16/2023   CREATININE 0.67 09/16/2023   BUN 12 09/16/2023   CO2 28 09/16/2023   TSH 3.080 10/26/2019     Assessment & Plan:  Essential hypertension, benign Assessment & Plan: Stable.  Continue current medications.  Medications refilled.  Labs ordered.  Orders: -     Atenolol ; Take 1 tablet (100 mg total) by mouth daily.  Dispense: 90 tablet; Refill: 3 -     Enalapril  Maleate; Take 1 tablet (20 mg total) by mouth daily.  Dispense: 90 tablet; Refill: 3 -     CMP14+EGFR  Depression, major, single episode, mild (HCC) Assessment & Plan: Stable.  Bupropion  refilled.  Orders: -     buPROPion  HCl ER (SR); Take 1 tablet (150 mg total) by mouth 2 (two) times daily.  Dispense: 180 tablet; Refill: 3  Pure hypercholesterolemia Assessment & Plan: Lipid panel ordered to assess.  Orders: -     Lipid panel  Screening for deficiency anemia -     CBC  Gastroesophageal reflux disease without esophagitis Assessment & Plan: Stable.     Follow-up: 6 months  Cosby Proby Bluford DO [facility]

## 2024-05-27 ENCOUNTER — Other Ambulatory Visit: Payer: Self-pay | Admitting: Family Medicine

## 2024-05-27 ENCOUNTER — Encounter: Payer: Self-pay | Admitting: Family Medicine

## 2024-05-27 MED ORDER — TRAZODONE HCL 50 MG PO TABS: 50.0000 mg | ORAL_TABLET | Freq: Every evening | ORAL | 3 refills | Status: DC | PRN

## 2024-06-19 ENCOUNTER — Other Ambulatory Visit: Payer: Self-pay | Admitting: Family Medicine

## 2024-07-02 LAB — CMP14+EGFR
ALT: 18 IU/L (ref 0–32)
AST: 22 IU/L (ref 0–40)
Albumin: 4.4 g/dL (ref 3.9–4.9)
Alkaline Phosphatase: 72 IU/L (ref 41–116)
BUN/Creatinine Ratio: 16 (ref 9–23)
BUN: 14 mg/dL (ref 6–24)
Bilirubin Total: 0.4 mg/dL (ref 0.0–1.2)
CO2: 22 mmol/L (ref 20–29)
Calcium: 9.6 mg/dL (ref 8.7–10.2)
Chloride: 99 mmol/L (ref 96–106)
Creatinine, Ser: 0.9 mg/dL (ref 0.57–1.00)
Globulin, Total: 1.8 g/dL (ref 1.5–4.5)
Glucose: 85 mg/dL (ref 70–99)
Potassium: 5 mmol/L (ref 3.5–5.2)
Sodium: 137 mmol/L (ref 134–144)
Total Protein: 6.2 g/dL (ref 6.0–8.5)
eGFR: 78 mL/min/1.73 (ref >59)

## 2024-07-02 LAB — LIPID PANEL
Chol/HDL Ratio: 4.8 ratio — ABNORMAL HIGH (ref 0.0–4.4)
Cholesterol, Total: 188 mg/dL (ref 100–199)
HDL: 39 mg/dL — ABNORMAL LOW (ref >39)
LDL Chol Calc (NIH): 124 mg/dL — ABNORMAL HIGH (ref 0–99)
Triglycerides: 137 mg/dL (ref 0–149)
VLDL Cholesterol Cal: 25 mg/dL (ref 5–40)

## 2024-07-02 LAB — CBC
Hematocrit: 41.1 % (ref 34.0–46.6)
Hemoglobin: 13.7 g/dL (ref 11.1–15.9)
MCH: 31.2 pg (ref 26.6–33.0)
MCHC: 33.3 g/dL (ref 31.5–35.7)
MCV: 94 fL (ref 79–97)
Platelets: 304 x10E3/uL (ref 150–450)
RBC: 4.39 x10E6/uL (ref 3.77–5.28)
RDW: 11.8 % (ref 11.7–15.4)
WBC: 5.9 x10E3/uL (ref 3.4–10.8)

## 2024-07-05 ENCOUNTER — Ambulatory Visit: Payer: Self-pay | Admitting: Family Medicine

## 2024-07-08 ENCOUNTER — Other Ambulatory Visit: Payer: Self-pay | Admitting: Family Medicine

## 2024-07-08 DIAGNOSIS — K219 Gastro-esophageal reflux disease without esophagitis: Secondary | ICD-10-CM

## 2024-08-06 ENCOUNTER — Encounter: Payer: Self-pay | Admitting: Family Medicine

## 2024-08-09 ENCOUNTER — Telehealth (INDEPENDENT_AMBULATORY_CARE_PROVIDER_SITE_OTHER): Payer: Self-pay | Admitting: Audiology

## 2024-08-09 NOTE — Telephone Encounter (Signed)
 Returned patient's call regarding hearing aid(s) that are broken.  I spoke with the patient and she stated that the Right Hearing Aid wire is broken.  The patient agreed to bring the hearing aids in for a Hearing Aid Check to be looked at.  I explained that since the hearing aids were no longer under warranty, there would be a fee for the repair.  Patient expressed understanding.  She is planning to drop off the Right Hearing Aid today or tomorrow.

## 2024-08-09 NOTE — Telephone Encounter (Signed)
 Cook, Jayce G, DO      08/08/24  2:13 PM Should have already been completed. She may need to get us  another copy. I don't believe it is in my folder.

## 2024-10-06 ENCOUNTER — Other Ambulatory Visit: Payer: Self-pay | Admitting: Family Medicine

## 2024-11-04 ENCOUNTER — Ambulatory Visit: Admitting: Family Medicine

## 2024-11-04 VITALS — BP 104/68 | HR 64 | Temp 98.6°F | Ht 64.0 in | Wt 175.0 lb

## 2024-11-04 DIAGNOSIS — F320 Major depressive disorder, single episode, mild: Secondary | ICD-10-CM

## 2024-11-04 DIAGNOSIS — I10 Essential (primary) hypertension: Secondary | ICD-10-CM

## 2024-11-04 DIAGNOSIS — E66811 Obesity, class 1: Secondary | ICD-10-CM | POA: Insufficient documentation

## 2024-11-04 DIAGNOSIS — F419 Anxiety disorder, unspecified: Secondary | ICD-10-CM

## 2024-11-04 MED ORDER — TIRZEPATIDE-WEIGHT MANAGEMENT 2.5 MG/0.5ML ~~LOC~~ SOLN: 2.5000 mg | SUBCUTANEOUS | 0 refills | Status: AC

## 2024-11-04 MED ORDER — ALPRAZOLAM 0.25 MG PO TABS: 0.2500 mg | ORAL_TABLET | Freq: Every evening | ORAL | 0 refills | Status: AC | PRN

## 2024-11-04 NOTE — Assessment & Plan Note (Signed)
 Alprazolam refilled.

## 2024-11-04 NOTE — Assessment & Plan Note (Signed)
 Stable.  Continue current medications.

## 2024-11-04 NOTE — Assessment & Plan Note (Signed)
"     Stable on Wellbutrin .          "

## 2024-11-04 NOTE — Assessment & Plan Note (Signed)
Rx sent in for Zepbound

## 2024-11-04 NOTE — Patient Instructions (Signed)
 Medications sent in.  Follow up annually.

## 2024-11-04 NOTE — Progress Notes (Signed)
 "  Subjective:  Patient ID: Melanie Clark, female    DOB: 24-Aug-1976  Age: 49 y.o. MRN: [id number]  CC:   Chief Complaint  Patient presents with   6 month follow up     HPI:  49 year old female presents for follow-up.  Hypertension stable on enalapril  and atenolol .  Patient requesting refill on alprazolam .  She uses this infrequently.  Patient interested in weight loss.  BMI currently 30.  She would like to start Zepbound  via [employer] direct.  We will discuss this today.  Patient declines flu vaccine.  Preventative health care updated today.  Patient Active Problem List   Diagnosis Date Noted   Obesity (BMI 30.0-34.9) 11/04/2024   Pure hypercholesterolemia 05/04/2024   Hyposomnia, insomnia or sleeplessness associated with anxiety 11/05/2023   GERD (gastroesophageal reflux disease) 12/06/2021   Depression, major, single episode, mild 11/17/2017   Essential hypertension, benign 01/06/2013    Social Hx   Social History   Socioeconomic History   Marital status: Divorced    Spouse name: Not on file   Number of children: Not on file   Years of education: Not on file   Highest education level: Bachelor's degree (e.g., BA, AB, BS)  Occupational History   Not on file  Tobacco Use   Smoking status: Never   Smokeless tobacco: Never  Vaping Use   Vaping status: Never Used  Substance and Sexual Activity   Alcohol use: Yes    Comment: social   Drug use: No   Sexual activity: Not on file  Other Topics Concern   Not on file  Social History Narrative   Not on file   Social Drivers of Health   Tobacco Use: Low Risk (05/04/2024)   Patient History    Smoking Tobacco Use: Never    Smokeless Tobacco Use: Never    Passive Exposure: Not on file  Financial Resource Strain: Low Risk (12/01/2023)   Overall Financial Resource Strain (CARDIA)    Difficulty of Paying Living Expenses: Not hard at all  Food Insecurity: No Food Insecurity (12/01/2023)   Hunger Vital Sign    Worried  About Running Out of Food in the Last Year: Never true    Ran Out of Food in the Last Year: Never true  Transportation Needs: No Transportation Needs (12/01/2023)   PRAPARE - Administrator, Civil Service (Medical): No    Lack of Transportation (Non-Medical): No  Physical Activity: Insufficiently Active (12/01/2023)   Exercise Vital Sign    Days of Exercise per Week: 3 days    Minutes of Exercise per Session: 40 min  Stress: No Stress Concern Present (12/01/2023)   [employer] of Occupational Health - Occupational Stress Questionnaire    Feeling of Stress : Not at all  Social Connections: Moderately Integrated (12/01/2023)   Social Connection and Isolation Panel    Frequency of Communication with Friends and Family: More than three times a week    Frequency of Social Gatherings with Friends and Family: Three times a week    Attends Religious Services: More than 4 times per year    Active Member of Clubs or Organizations: Yes    Attends Banker Meetings: More than 4 times per year    Marital Status: Divorced  Depression (PHQ2-9): Low Risk (11/04/2024)   Depression (PHQ2-9)    PHQ-2 Score: 1  Alcohol Screen: Low Risk (12/01/2023)   Alcohol Screen    Last Alcohol Screening Score (AUDIT): 2  Housing: Low  Risk (12/01/2023)   Housing Stability Vital Sign    Unable to Pay for Housing in the Last Year: No    Number of Times Moved in the Last Year: 0    Homeless in the Last Year: No  Utilities: Not on file  Health Literacy: Not on file    Review of Systems Per HPI  Objective:  BP 104/68   Pulse 64   Temp 98.6 F (37 C)   Ht 5' 4 (1.626 m)   Wt 175 lb (79.4 kg)   LMP 12/23/2012   SpO2 98%   BMI 30.04 kg/m      11/04/2024    8:08 AM 05/04/2024    8:32 AM 01/07/2024    4:51 PM  BP/Weight  Systolic BP 104 123 154  Diastolic BP 68 82 90  Wt. (Lbs) 175 171 167  BMI 30.04 kg/m2 29.35 kg/m2 28.67 kg/m2    Physical Exam  Lab Results  Component Value Date    WBC 5.9 07/01/2024   HGB 13.7 07/01/2024   HCT 41.1 07/01/2024   PLT 304 07/01/2024   GLUCOSE 85 07/01/2024   CHOL 188 07/01/2024   TRIG 137 07/01/2024   HDL 39 (L) 07/01/2024   LDLCALC 124 (H) 07/01/2024   ALT 18 07/01/2024   AST 22 07/01/2024   NA 137 07/01/2024   K 5.0 07/01/2024   CL 99 07/01/2024   CREATININE 0.90 07/01/2024   BUN 14 07/01/2024   CO2 22 07/01/2024   TSH 3.080 10/26/2019     Assessment & Plan:  Essential hypertension, benign Assessment & Plan: Stable.  Continue current medications.   Hyposomnia, insomnia or sleeplessness associated with anxiety Assessment & Plan: Alprazolam  refilled.  Orders: -     ALPRAZolam ; Take 1 tablet (0.25 mg total) by mouth at bedtime as needed for anxiety.  Dispense: 20 tablet; Refill: 0  Obesity (BMI 30.0-34.9) Assessment & Plan: Rx sent in for Zepbound .  Orders: -     Tirzepatide -Weight Management; Inject 2.5 mg into the skin once a week.  Dispense: 2 mL; Refill: 0  Depression, major, single episode, mild Assessment & Plan: Stable on Wellbutrin .     Follow-up: Annually  Jacqulyn Ahle DO [facility] "

## 2025-05-04 ENCOUNTER — Ambulatory Visit: Admitting: Family Medicine
